# Patient Record
Sex: Female | Born: 1985 | Race: Black or African American | Hispanic: No | Marital: Single | Smoking: Former smoker
Health system: Southern US, Community
[De-identification: ages and names within clinical notes are randomized; demographics above are authoritative.]

## PROBLEM LIST (undated history)

## (undated) ENCOUNTER — Inpatient Hospital Stay (HOSPITAL_COMMUNITY): Payer: Self-pay

## (undated) DIAGNOSIS — Z975 Presence of (intrauterine) contraceptive device: Secondary | ICD-10-CM

## (undated) DIAGNOSIS — O139 Gestational [pregnancy-induced] hypertension without significant proteinuria, unspecified trimester: Secondary | ICD-10-CM

## (undated) DIAGNOSIS — Z8619 Personal history of other infectious and parasitic diseases: Secondary | ICD-10-CM

## (undated) DIAGNOSIS — R768 Other specified abnormal immunological findings in serum: Secondary | ICD-10-CM

## (undated) DIAGNOSIS — I10 Essential (primary) hypertension: Secondary | ICD-10-CM

## (undated) DIAGNOSIS — O24419 Gestational diabetes mellitus in pregnancy, unspecified control: Secondary | ICD-10-CM

## (undated) DIAGNOSIS — O1490 Unspecified pre-eclampsia, unspecified trimester: Secondary | ICD-10-CM

## (undated) DIAGNOSIS — R87610 Atypical squamous cells of undetermined significance on cytologic smear of cervix (ASC-US): Secondary | ICD-10-CM

## (undated) HISTORY — DX: Personal history of other infectious and parasitic diseases: Z86.19

## (undated) HISTORY — DX: Presence of (intrauterine) contraceptive device: Z97.5

## (undated) HISTORY — DX: Essential (primary) hypertension: I10

## (undated) HISTORY — DX: Atypical squamous cells of undetermined significance on cytologic smear of cervix (ASC-US): R87.610

## (undated) HISTORY — PX: BREAST CYST EXCISION: SHX579

## (undated) HISTORY — PX: BREAST SURGERY: SHX581

## (undated) HISTORY — DX: Gestational diabetes mellitus in pregnancy, unspecified control: O24.419

---
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
## 2006-01-21 ENCOUNTER — Emergency Department (HOSPITAL_COMMUNITY): Admission: EM | Admit: 2006-01-21 | Discharge: 2006-01-21 | Payer: Self-pay | Admitting: Emergency Medicine

## 2006-10-19 ENCOUNTER — Emergency Department (HOSPITAL_COMMUNITY): Admission: EM | Admit: 2006-10-19 | Discharge: 2006-10-19 | Payer: Self-pay | Admitting: Emergency Medicine

## 2006-12-28 ENCOUNTER — Inpatient Hospital Stay (HOSPITAL_COMMUNITY): Admission: AD | Admit: 2006-12-28 | Discharge: 2006-12-28 | Payer: Self-pay | Admitting: Obstetrics & Gynecology

## 2007-01-31 ENCOUNTER — Ambulatory Visit (HOSPITAL_COMMUNITY): Admission: RE | Admit: 2007-01-31 | Discharge: 2007-01-31 | Payer: Self-pay | Admitting: Obstetrics

## 2007-04-13 ENCOUNTER — Inpatient Hospital Stay (HOSPITAL_COMMUNITY): Admission: AD | Admit: 2007-04-13 | Discharge: 2007-04-13 | Payer: Self-pay | Admitting: Obstetrics & Gynecology

## 2007-05-16 ENCOUNTER — Inpatient Hospital Stay (HOSPITAL_COMMUNITY): Admission: AD | Admit: 2007-05-16 | Discharge: 2007-05-21 | Payer: Self-pay | Admitting: Obstetrics

## 2008-05-09 ENCOUNTER — Emergency Department (HOSPITAL_COMMUNITY): Admission: EM | Admit: 2008-05-09 | Discharge: 2008-05-09 | Payer: Self-pay | Admitting: Emergency Medicine

## 2008-05-23 ENCOUNTER — Emergency Department (HOSPITAL_COMMUNITY): Admission: EM | Admit: 2008-05-23 | Discharge: 2008-05-23 | Payer: Self-pay | Admitting: Emergency Medicine

## 2008-08-26 ENCOUNTER — Emergency Department (HOSPITAL_COMMUNITY): Admission: EM | Admit: 2008-08-26 | Discharge: 2008-08-27 | Payer: Self-pay | Admitting: Emergency Medicine

## 2008-08-27 ENCOUNTER — Ambulatory Visit (HOSPITAL_COMMUNITY): Admission: RE | Admit: 2008-08-27 | Discharge: 2008-08-27 | Payer: Self-pay | Admitting: Emergency Medicine

## 2010-06-26 LAB — CBC
HCT: 28.3 % — ABNORMAL LOW (ref 36.0–46.0)
Hemoglobin: 10 g/dL — ABNORMAL LOW (ref 12.0–15.0)
MCHC: 35.2 g/dL (ref 30.0–36.0)
MCV: 88.1 fL (ref 78.0–100.0)
RBC: 3.22 MIL/uL — ABNORMAL LOW (ref 3.87–5.11)
WBC: 8.5 10*3/uL (ref 4.0–10.5)

## 2010-06-26 LAB — DIFFERENTIAL
Eosinophils Absolute: 0.2 10*3/uL (ref 0.0–0.7)
Eosinophils Relative: 2 % (ref 0–5)
Lymphs Abs: 1.6 10*3/uL (ref 0.7–4.0)
Monocytes Absolute: 1.1 10*3/uL — ABNORMAL HIGH (ref 0.1–1.0)
Monocytes Relative: 13 % — ABNORMAL HIGH (ref 3–12)
Neutrophils Relative %: 65 % (ref 43–77)

## 2010-08-04 NOTE — Op Note (Signed)
   NAME:  Julia [name], Julia [name]                        ACCOUNT NO.:  [id number]   MEDICAL RECORD NO.:  [id number]                   PATIENT TYPE:  AMB   LOCATION:  DAY                                  FACILITY:  [facility]   PHYSICIAN:  Jerolyn Shin C. Katrinka Blazing, M.D.                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  The  patient has developed a mass in the left upper outer quadrant.   PREOPERATIVE DIAGNOSIS:  Left breast mass.   POSTOPERATIVE DIAGNOSIS:  Left breast mass.   PROCEDURE:  Left breast biopsy.   SURGEON:  Dirk Dress. Katrinka Blazing, M.D.   DESCRIPTION OF PROCEDURE:  Under general LMA anesthesia, the left breast was  prepped and draped in a sterile field.   A curvilinear incision was made in the upper outer quadrant over the mass  which was quite mobile.  The incision extended through the subcutaneous  tissue to the mass and breast tissue surrounding it.  This tissue was  excised using electrocautery without difficulty.  The mass was sent for  pathologic evaluation.  The tissues were closed with 3-0 Biosyn of the depth  of the wound up to the subcutaneous area.  The skin and subcutaneous tissue  was closed with 4-0 Dexon.  The patient tolerated the procedure well.  A  sterile dressing was placed.   She was awakened from anesthesia uneventfully, transferred to a bed, and  taken to the postanesthetic care unit.                                               Dirk Dress. Katrinka Blazing, M.D.    LCS/MEDQ  D:  10/13/2002  T:  10/13/2002  Job:  [id number]

## 2010-08-04 NOTE — H&P (Signed)
   NAME:  Julia [name], Julia [name]                        ACCOUNT NO.:  [id number]   MEDICAL RECORD NO.:  [id number]                   PATIENT TYPE:  AMB   LOCATION:  DAY                                  FACILITY:  [facility]   PHYSICIAN:  Jerolyn Shin C. Katrinka Blazing, M.D.                DATE OF BIRTH:  September 10, 1985   DATE OF ADMISSION:  DATE OF DISCHARGE:                                HISTORY & PHYSICAL   HISTORY OF PRESENT ILLNESS:  A 25 year old female with history of recurrent  mass in the left breast.  The patient is status post excision of a juvenile  fibroadenoma.  She had excision of the juvenile adenoma August 2002.  Her  previous mass was in the lower inner quadrant.  Present mass in the upper  outer quadrant.  She is scheduled to have this mass removed under  anesthesia.   PAST MEDICAL HISTORY:  No medical illnesses.   ALLERGIES:  No known drug allergies.   MEDICATIONS:  None.   PAST SURGICAL HISTORY:  Left breast biopsy.   SOCIAL HISTORY:  The patient does not drink, smoke, or use drugs.   PHYSICAL EXAMINATION:  GENERAL:  Healthy-appearing young female in no acute  distress.  VITAL SIGNS:  Blood pressure 100/70, pulse 90, respirations 20.  Weight 106  pounds.  HEENT:  Unremarkable.  NECK:  Supple without JVD or bruits.  CHEST:  Clear to auscultation.  HEART:  Regular rate and rhythm without murmur, gallop, or rub.  BREASTS: Very nodular breast with a well-healed incision in the left lower  inner quadrant and a palpable mass in the led upper outer quadrant.  No  axillary masses.  Right breast normal.  ABDOMEN:  Soft, nontender, no masses.  EXTREMITIES:  No cyanosis, clubbing, or edema.  NEUROLOGIC:  Nonfocal.  No motor, sensory, or cerebellar deficits.   IMPRESSION:  1. Left breast mass.  2. History of juvenile fibroadenoma, left breast.   PLAN:  Left breast biopsy.                                              Dirk Dress. Katrinka Blazing, M.D.   LCS/MEDQ  D:  10/12/2002  T:  10/13/2002  Job:   [id number]

## 2010-12-07 LAB — URINALYSIS, ROUTINE W REFLEX MICROSCOPIC
Glucose, UA: 250 — AB
Hgb urine dipstick: NEGATIVE
Ketones, ur: 15 — AB
pH: 5.5

## 2010-12-08 LAB — COMPREHENSIVE METABOLIC PANEL
ALT: 24
ALT: 24
Albumin: 2.3 — ABNORMAL LOW
Alkaline Phosphatase: 109
Alkaline Phosphatase: 114
CO2: 23
GFR calc Af Amer: >60
GFR calc non Af Amer: >60
Glucose, Bld: 96
Potassium: 3.6
Potassium: 3.9
Sodium: 132 — ABNORMAL LOW
Sodium: 133 — ABNORMAL LOW
Total Protein: 5.7 — ABNORMAL LOW

## 2010-12-08 LAB — CBC
HCT: 34.1 — ABNORMAL LOW
Hemoglobin: 12
MCHC: 35.3
Platelets: 194
RBC: 3.69 — ABNORMAL LOW
RDW: 13.5

## 2010-12-08 LAB — RPR: RPR Ser Ql: NONREACTIVE

## 2010-12-08 LAB — URIC ACID
Uric Acid, Serum: 5.7
Uric Acid, Serum: 6.4

## 2010-12-11 LAB — CBC
HCT: 32.4 — ABNORMAL LOW
Hemoglobin: 11.3 — ABNORMAL LOW
MCHC: 34.9
RDW: 13.8

## 2010-12-28 LAB — URINALYSIS, ROUTINE W REFLEX MICROSCOPIC
Bilirubin Urine: NEGATIVE
Hgb urine dipstick: NEGATIVE
Specific Gravity, Urine: >1.03 — ABNORMAL HIGH
Urobilinogen, UA: 0.2

## 2010-12-28 LAB — URINE CULTURE: Colony Count: NO GROWTH

## 2011-01-01 LAB — DIFFERENTIAL
Basophils Absolute: 0
Basophils Relative: 0
Eosinophils Absolute: 0.3
Neutro Abs: 8.4 — ABNORMAL HIGH
Neutrophils Relative %: 70

## 2011-01-01 LAB — URINALYSIS, ROUTINE W REFLEX MICROSCOPIC
Bilirubin Urine: NEGATIVE
Ketones, ur: NEGATIVE
Nitrite: NEGATIVE
Protein, ur: NEGATIVE
Specific Gravity, Urine: >1.03 — ABNORMAL HIGH
Urobilinogen, UA: 0.2

## 2011-01-01 LAB — PREGNANCY, URINE: Preg Test, Ur: POSITIVE

## 2011-01-01 LAB — WET PREP, GENITAL
Clue Cells Wet Prep HPF POC: NONE SEEN
Trich, Wet Prep: NONE SEEN
WBC, Wet Prep HPF POC: NONE SEEN
Yeast Wet Prep HPF POC: NONE SEEN

## 2011-01-01 LAB — CBC
MCHC: 34.1
MCV: 91.3
RDW: 13

## 2011-01-01 LAB — ABO/RH: ABO/RH(D): A POS

## 2011-01-01 LAB — GC/CHLAMYDIA PROBE AMP, GENITAL: GC Probe Amp, Genital: NEGATIVE

## 2011-03-11 ENCOUNTER — Emergency Department (HOSPITAL_COMMUNITY): Payer: Self-pay

## 2011-03-11 ENCOUNTER — Emergency Department (HOSPITAL_COMMUNITY)
Admission: EM | Admit: 2011-03-11 | Discharge: 2011-03-11 | Disposition: A | Discharge status: Home / Self Care | Payer: Self-pay | Attending: Emergency Medicine | Admitting: Emergency Medicine

## 2011-03-11 DIAGNOSIS — Y9269 Other specified industrial and construction area as the place of occurrence of the external cause: Secondary | ICD-10-CM | POA: Insufficient documentation

## 2011-03-11 DIAGNOSIS — S93409A Sprain of unspecified ligament of unspecified ankle, initial encounter: Secondary | ICD-10-CM | POA: Insufficient documentation

## 2011-03-11 DIAGNOSIS — X500XXA Overexertion from strenuous movement or load, initial encounter: Secondary | ICD-10-CM | POA: Insufficient documentation

## 2011-03-11 MED ORDER — HYDROCODONE-ACETAMINOPHEN 5-325 MG PO TABS: ORAL_TABLET | ORAL | Status: AC

## 2011-03-11 MED ORDER — NAPROXEN 500 MG PO TABS: 500.0000 mg | ORAL_TABLET | Freq: Two times a day (BID) | ORAL | Status: DC

## 2011-03-11 NOTE — ED Provider Notes (Signed)
History     CSN: [id number]  Arrival date & time 03/11/11  4098   First MD Initiated Contact with Patient 03/11/11 1952      Chief Complaint  Patient presents with  . Ankle Injury    (Consider location/radiation/quality/duration/timing/severity/associated sxs/prior treatment) Patient is a 25 y.o. female presenting with ankle pain. The history is provided by the patient.  Ankle Pain  The incident occurred 2 days ago. The incident occurred at work. The injury mechanism was torsion. The pain is present in the right ankle. The quality of the pain is described as aching. The pain is mild. The pain has been constant since onset. Pertinent negatives include no numbness, no inability to bear weight, no loss of motion, no muscle weakness, no loss of sensation and no tingling. She reports no foreign bodies present. The symptoms are aggravated by activity, bearing weight and palpation. She has tried nothing for the symptoms. The treatment provided no relief.    History reviewed. No pertinent past medical history.  Past Surgical History  Procedure Date  . Breast surgery     No family history on file.  History  Substance Use Topics  . Smoking status: Never Smoker   . Smokeless tobacco: Not on file  . Alcohol Use: Yes    OB History    Grav Para Term Preterm Abortions TAB SAB Ect Mult Living                  Review of Systems  Constitutional: Negative for fever and chills.  HENT: Negative for neck pain.   Musculoskeletal: Positive for arthralgias. Negative for myalgias and back pain.  Skin: Negative.  Negative for rash.  Neurological: Negative for dizziness, tingling, weakness and numbness.  Hematological: Does not bruise/bleed easily.    Allergies  Review of patient's allergies indicates no known allergies.  Home Medications  No current outpatient prescriptions on file.  BP 133/77  Pulse 78  Temp(Src) 98.7 F (37.1 C) (Oral)  Resp 16  Ht 5\' 6"  (1.676 m)  Wt 154 lb  (69.854 kg)  BMI 24.86 kg/m2  SpO2 100%  LMP 02/11/2011  Physical Exam  Nursing note and vitals reviewed. Constitutional: She is oriented to person, place, and time. She appears well-developed and well-nourished. No distress.  HENT:  Head: Normocephalic and atraumatic.  Neck: Normal range of motion. Neck supple.  Cardiovascular: Normal rate, regular rhythm and normal heart sounds.   Pulmonary/Chest: Effort normal and breath sounds normal. No respiratory distress. She exhibits no tenderness.  Musculoskeletal: Normal range of motion. She exhibits tenderness. She exhibits no edema.       Right ankle: She exhibits normal range of motion, no swelling, no ecchymosis, no deformity, no laceration and normal pulse. tenderness. Lateral malleolus tenderness found. No medial malleolus, no posterior TFL, no head of 5th metatarsal and no proximal fibula tenderness found. Achilles tendon normal.  Lymphadenopathy:    She has no cervical adenopathy.  Neurological: She is alert and oriented to person, place, and time. No cranial nerve deficit. She exhibits normal muscle tone. Coordination normal.  Skin: Skin is warm and dry.    ED Course  SPLINT APPLICATION Date/Time: 03/11/2011 8:52 PM Performed by: Trisha Mangle, Bishop Vanderwerf L. Authorized by: Maxwell Caul Consent: Verbal consent obtained. Written consent not obtained. Consent given by: patient Patient understanding: patient states understanding of the procedure being performed Patient consent: the patient's understanding of the procedure matches consent given Imaging studies: imaging studies available Patient identity confirmed: verbally with  patient Time out: Immediately prior to procedure a "time out" was called to verify the correct patient, procedure, equipment, support staff and site/side marked as required. Location details: right ankle Splint type: aso spliint. Supplies used: crutches. Post-procedure: The splinted body part was neurovascularly  unchanged following the procedure. Patient tolerance: Patient tolerated the procedure well with no immediate complications.   (including critical care time)  Labs Reviewed - No data to display Dg Ankle Complete Right  03/11/2011  *RADIOLOGY REPORT*  Clinical Data: Ankle injury.  Ankle pain and swelling.  RIGHT ANKLE - COMPLETE 3+ VIEW  Comparison: None.  Findings: Soft tissue swelling is seen overlying the lateral malleolus.  No ankle joint effusion identified.  No evidence of fracture or dislocation.  No other significant bone abnormality identified.  IMPRESSION: Lateral soft tissue swelling.  No evidence of fracture.  Original Report Authenticated By: Danae Orleans, M.D.        MDM   Mild ttp of the lateral right malleolus.  Mild STS.  No obvious ligament instability.  No bruising.  Will apply ASO splint, crutches and pt agrees to ortho f/u        Adolphus Hanf L. Avary P[place]en[place]ger, [place] 03/14/11 2050

## 2011-03-11 NOTE — ED Notes (Signed)
Pt presents with right ankle pain after twisting it on Thursday. Pt limping. No bruising noted. Swelling present and no obvious deformity noted.

## 2011-03-15 NOTE — ED Provider Notes (Signed)
Medical screening examination/treatment/procedure(s) were performed by non-physician practitioner and as supervising physician I was immediately available for consultation/collaboration.  Caliann Leckrone L Rucker Pridgeon, MD 03/15/11 0059 

## 2011-07-13 ENCOUNTER — Encounter (HOSPITAL_COMMUNITY): Payer: Self-pay

## 2011-07-13 ENCOUNTER — Emergency Department (HOSPITAL_COMMUNITY)
Admission: EM | Admit: 2011-07-13 | Discharge: 2011-07-14 | Disposition: A | Discharge status: Home / Self Care | Payer: Self-pay | Attending: Emergency Medicine | Admitting: Emergency Medicine

## 2011-07-13 DIAGNOSIS — R51 Headache: Secondary | ICD-10-CM | POA: Insufficient documentation

## 2011-07-13 DIAGNOSIS — R42 Dizziness and giddiness: Secondary | ICD-10-CM | POA: Insufficient documentation

## 2011-07-13 DIAGNOSIS — H538 Other visual disturbances: Secondary | ICD-10-CM | POA: Insufficient documentation

## 2011-07-13 HISTORY — DX: Unspecified pre-eclampsia, unspecified trimester: O14.90

## 2011-07-13 MED ORDER — DIPHENHYDRAMINE HCL 50 MG/ML IJ SOLN
25.0000 mg | Freq: Once | INTRAMUSCULAR | Status: AC
  Administered 2011-07-13: 25 mg via INTRAVENOUS
  Filled 2011-07-13: qty 1

## 2011-07-13 MED ORDER — SODIUM CHLORIDE 0.9 % IV BOLUS (SEPSIS)
1000.0000 mL | Freq: Once | INTRAVENOUS | Status: AC
  Administered 2011-07-13: 1000 mL via INTRAVENOUS

## 2011-07-13 MED ORDER — METOCLOPRAMIDE HCL 5 MG/ML IJ SOLN
10.0000 mg | Freq: Once | INTRAMUSCULAR | Status: AC
  Administered 2011-07-13: 10 mg via INTRAVENOUS
  Filled 2011-07-13: qty 2

## 2011-07-13 MED ORDER — DEXAMETHASONE SODIUM PHOSPHATE 4 MG/ML IJ SOLN
10.0000 mg | Freq: Once | INTRAMUSCULAR | Status: AC
  Administered 2011-07-13: 10 mg via INTRAVENOUS
  Filled 2011-07-13: qty 3

## 2011-07-13 NOTE — ED Notes (Signed)
C/o HA with nausea, + family hx of HTN

## 2011-07-13 NOTE — ED Notes (Signed)
Pt presents with dizziness and blurred vision to left eye that started today around 11am. Pt denies Hx of HTN.

## 2011-07-14 MED ORDER — PROMETHAZINE HCL 25 MG PO TABS: 25.0000 mg | ORAL_TABLET | Freq: Four times a day (QID) | ORAL | Status: DC | PRN

## 2011-07-14 NOTE — ED Notes (Signed)
Discharge instructions reviewed with pt, questions answered. Pt verbalized understanding.  

## 2011-07-14 NOTE — Discharge Instructions (Signed)
Headaches, Frequently Asked Questions Rest, drink clear fluids and take Tylenol as needed for headaches. Call your doctor for followup in the clinic as discussed regarding your pregnancy test. Continue taking vitamins.  MIGRAINE HEADACHES Q: What is migraine? What causes it? How can I treat it? A: Generally, migraine headaches begin as a dull ache. Then they develop into a constant, throbbing, and pulsating pain. You may experience pain at the temples. You may experience pain at the front or back of one or both sides of the head. The pain is usually accompanied by a combination of:  Nausea.   Vomiting.   Sensitivity to light and noise.  Some people (about 15%) experience an aura (see below) before an attack. The cause of migraine is believed to be chemical reactions in the brain. Treatment for migraine may include over-the-counter or prescription medications. It may also include self-help techniques. These include relaxation training and biofeedback.  Q: What is an aura? A: About 15% of people with migraine get an "aura". This is a sign of neurological symptoms that occur before a migraine headache. You may see wavy or jagged lines, dots, or flashing lights. You might experience tunnel vision or blind spots in one or both eyes. The aura can include visual or auditory hallucinations (something imagined). It may include disruptions in smell (such as strange odors), taste or touch. Other symptoms include:  Numbness.   A "pins and needles" sensation.   Difficulty in recalling or speaking the correct word.  These neurological events may last as long as 60 minutes. These symptoms will fade as the headache begins. Q: What is a trigger? A: Certain physical or environmental factors can lead to or "trigger" a migraine. These include:  Foods.   Hormonal changes.   Weather.   Stress.  It is important to remember that triggers are different for everyone. To help prevent migraine attacks, you need  to figure out which triggers affect you. Keep a headache diary. This is a good way to track triggers. The diary will help you talk to your healthcare professional about your condition. Q: Does weather affect migraines? A: Bright sunshine, hot, humid conditions, and drastic changes in barometric pressure may lead to, or "trigger," a migraine attack in some people. But studies have shown that weather does not act as a trigger for everyone with migraines. Q: What is the link between migraine and hormones? A: Hormones start and regulate many of your body's functions. Hormones keep your body in balance within a constantly changing environment. The levels of hormones in your body are unbalanced at times. Examples are during menstruation, pregnancy, or menopause. That can lead to a migraine attack. In fact, about three quarters of all women with migraine report that their attacks are related to the menstrual cycle.  Q: Is there an increased risk of stroke for migraine sufferers? A: The likelihood of a migraine attack causing a stroke is very remote. That is not to say that migraine sufferers cannot have a stroke associated with their migraines. In persons under age 73, the most common associated factor for stroke is migraine headache. But over the course of a person's normal life span, the occurrence of migraine headache may actually be associated with a reduced risk of dying from cerebrovascular disease due to stroke.  Q: What are acute medications for migraine? A: Acute medications are used to treat the pain of the headache after it has started. Examples over-the-counter medications, NSAIDs, ergots, and triptans.  Q: What are  the triptans? A: Triptans are the newest class of abortive medications. They are specifically targeted to treat migraine. Triptans are vasoconstrictors. They moderate some chemical reactions in the brain. The triptans work on receptors in your brain. Triptans help to restore the balance of  a neurotransmitter called serotonin. Fluctuations in levels of serotonin are thought to be a main cause of migraine.  Q: Are over-the-counter medications for migraine effective? A: Over-the-counter, or "OTC," medications may be effective in relieving mild to moderate pain and associated symptoms of migraine. But you should see your caregiver before beginning any treatment regimen for migraine.  Q: What are preventive medications for migraine? A: Preventive medications for migraine are sometimes referred to as "prophylactic" treatments. They are used to reduce the frequency, severity, and length of migraine attacks. Examples of preventive medications include antiepileptic medications, antidepressants, beta-blockers, calcium channel blockers, and NSAIDs (nonsteroidal anti-inflammatory drugs). Q: Why are anticonvulsants used to treat migraine? A: During the past few years, there has been an increased interest in antiepileptic drugs for the prevention of migraine. They are sometimes referred to as "anticonvulsants". Both epilepsy and migraine may be caused by similar reactions in the brain.  Q: Why are antidepressants used to treat migraine? A: Antidepressants are typically used to treat people with depression. They may reduce migraine frequency by regulating chemical levels, such as serotonin, in the brain.  Q: What alternative therapies are used to treat migraine? A: The term "alternative therapies" is often used to describe treatments considered outside the scope of conventional Western medicine. Examples of alternative therapy include acupuncture, acupressure, and yoga. Another common alternative treatment is herbal therapy. Some herbs are believed to relieve headache pain. Always discuss alternative therapies with your caregiver before proceeding. Some herbal products contain arsenic and other toxins. TENSION HEADACHES Q: What is a tension-type headache? What causes it? How can I treat it? A:  Tension-type headaches occur randomly. They are often the result of temporary stress, anxiety, fatigue, or anger. Symptoms include soreness in your temples, a tightening band-like sensation around your head (a "vice-like" ache). Symptoms can also include a pulling feeling, pressure sensations, and contracting head and neck muscles. The headache begins in your forehead, temples, or the back of your head and neck. Treatment for tension-type headache may include over-the-counter or prescription medications. Treatment may also include self-help techniques such as relaxation training and biofeedback. CLUSTER HEADACHES Q: What is a cluster headache? What causes it? How can I treat it? A: Cluster headache gets its name because the attacks come in groups. The pain arrives with little, if any, warning. It is usually on one side of the head. A tearing or bloodshot eye and a runny nose on the same side of the headache may also accompany the pain. Cluster headaches are believed to be caused by chemical reactions in the brain. They have been described as the most severe and intense of any headache type. Treatment for cluster headache includes prescription medication and oxygen. SINUS HEADACHES Q: What is a sinus headache? What causes it? How can I treat it? A: When a cavity in the bones of the face and skull (a sinus) becomes inflamed, the inflammation will cause localized pain. This condition is usually the result of an allergic reaction, a tumor, or an infection. If your headache is caused by a sinus blockage, such as an infection, you will probably have a fever. An x-ray will confirm a sinus blockage. Your caregiver's treatment might include antibiotics for the infection, as well as  antihistamines or decongestants.  REBOUND HEADACHES Q: What is a rebound headache? What causes it? How can I treat it? A: A pattern of taking acute headache medications too often can lead to a condition known as "rebound headache." A  pattern of taking too much headache medication includes taking it more than 2 days per week or in excessive amounts. That means more than the label or a caregiver advises. With rebound headaches, your medications not only stop relieving pain, they actually begin to cause headaches. Doctors treat rebound headache by tapering the medication that is being overused. Sometimes your caregiver will gradually substitute a different type of treatment or medication. Stopping may be a challenge. Regularly overusing a medication increases the potential for serious side effects. Consult a caregiver if you regularly use headache medications more than 2 days per week or more than the label advises. ADDITIONAL QUESTIONS AND ANSWERS Q: What is biofeedback? A: Biofeedback is a self-help treatment. Biofeedback uses special equipment to monitor your body's involuntary physical responses. Biofeedback monitors:  Breathing.   Pulse.   Heart rate.   Temperature.   Muscle tension.   Brain activity.  Biofeedback helps you refine and perfect your relaxation exercises. You learn to control the physical responses that are related to stress. Once the technique has been mastered, you do not need the equipment any more. Q: Are headaches hereditary? A: Four out of five (80%) of people that suffer report a family history of migraine. Scientists are not sure if this is genetic or a family predisposition. Despite the uncertainty, a child has a 50% chance of having migraine if one parent suffers. The child has a 75% chance if both parents suffer.  Q: Can children get headaches? A: By the time they reach high school, most young people have experienced some type of headache. Many safe and effective approaches or medications can prevent a headache from occurring or stop it after it has begun.  Q: What type of doctor should I see to diagnose and treat my headache? A: Start with your primary caregiver. Discuss his or her experience and  approach to headaches. Discuss methods of classification, diagnosis, and treatment. Your caregiver may decide to recommend you to a headache specialist, depending upon your symptoms or other physical conditions. Having diabetes, allergies, etc., may require a more comprehensive and inclusive approach to your headache. The [employer] will provide, upon request, a list of [facility] physician members in your state.

## 2011-07-14 NOTE — ED Provider Notes (Signed)
History     CSN: [id number]  Arrival date & time 07/13/11  2019   First MD Initiated Contact with Patient 07/13/11 2300      Chief complaint headache  (Consider location/radiation/quality/duration/timing/severity/associated sxs/prior treatment) HPI History provided by the patient. Patient complaining of gradual onset headache worsening throughout the day. She has some associated blurry vision left eye and feels lightheaded. Light does bother her eyes some. She denies a history of migraine headaches. No weakness or numbness. No difficulty with speech or gait. LMP April 5, Normal and on time. No abdominal pain, vaginal bleeding or discharge   Past Medical History  Diagnosis Date  . Preeclampsia     Past Surgical History  Procedure Date  . Breast surgery     No family history on file.  History  Substance Use Topics  . Smoking status: Never Smoker   . Smokeless tobacco: Not on file  . Alcohol Use: Yes    OB History    Grav Para Term Preterm Abortions TAB SAB Ect Mult Living                  Review of Systems  Constitutional: Negative for fever and chills.  HENT: Negative for neck pain and neck stiffness.   Eyes: Negative for pain.  Respiratory: Negative for shortness of breath.   Cardiovascular: Negative for chest pain.  Gastrointestinal: Negative for abdominal pain.  Genitourinary: Negative for dysuria.  Musculoskeletal: Negative for back pain.  Skin: Negative for rash.  Neurological: Positive for headaches.  All other systems reviewed and are negative.    Allergies  Review of patient's allergies indicates no known allergies.  Home Medications   Current Outpatient Rx  Name Route Sig Dispense Refill  . IBUPROFEN 200 MG PO TABS Oral Take 800 mg by mouth as needed. For pain    . ONE-A-DAY WOMENS PO Oral Take 1 tablet by mouth daily.      BP 144/98  Pulse 86  Temp(Src) 98.1 F (36.7 C) (Oral)  Resp 20  Ht 5\' 5"  (1.651 m)  Wt 154 lb (69.854 kg)  BMI  25.63 kg/m2  SpO2 100%  LMP 06/25/2011  Physical Exam  Constitutional: She is oriented to person, place, and time. She appears well-developed and well-nourished.  HENT:  Head: Normocephalic and atraumatic.  Eyes: Conjunctivae and EOM are normal. Pupils are equal, round, and reactive to light.  Neck: Full passive range of motion without pain. Neck supple. No thyromegaly present.       No meningismus  Cardiovascular: Normal rate, regular rhythm, S1 normal, S2 normal and intact distal pulses.   Pulmonary/Chest: Effort normal and breath sounds normal.  Abdominal: Soft. Bowel sounds are normal. She exhibits no mass. There is no tenderness. There is no rebound, no guarding and no CVA tenderness.  Musculoskeletal: Normal range of motion.  Neurological: She is alert and oriented to person, place, and time. She has normal strength and normal reflexes. No cranial nerve deficit or sensory deficit. She displays a negative Romberg sign. GCS eye subscore is 4. GCS verbal subscore is 5. GCS motor subscore is 6.       Normal Gait  Skin: Skin is warm and dry. No rash noted. No cyanosis. Nails show no clubbing.  Psychiatric: She has a normal mood and affect. Her speech is normal and behavior is normal.    ED Course  Procedures (including critical care time)  Results for orders placed during the hospital encounter of 08/26/08  CBC  Component Value Range   WBC 8.5  4.0 - 10.5 (K/uL)   RBC 3.22 (*) 3.87 - 5.11 (MIL/uL)   Hemoglobin 10.0 (*) 12.0 - 15.0 (g/dL)   HCT 16.1 (*) 09.6 - 46.0 (%)   MCV 88.1  78.0 - 100.0 (fL)   MCHC 35.2  30.0 - 36.0 (g/dL)   RDW 04.5  40.9 - 81.1 (%)   Platelets 356  150 - 400 (K/uL)  DIFFERENTIAL      Component Value Range   Neutrophils Relative 65  43 - 77 (%)   Neutro Abs 5.6  1.7 - 7.7 (K/uL)   Lymphocytes Relative 19  12 - 46 (%)   Lymphs Abs 1.6  0.7 - 4.0 (K/uL)   Monocytes Relative 13 (*) 3 - 12 (%)   Monocytes Absolute 1.1 (*) 0.1 - 1.0 (K/uL)    Eosinophils Relative 2  0 - 5 (%)   Eosinophils Absolute 0.2  0.0 - 0.7 (K/uL)   Basophils Relative 1  0 - 1 (%)   Basophils Absolute 0.1  0.0 - 0.1 (K/uL)  PREGNANCY, URINE      Component Value Range   Preg Test, Ur POSITIVE     IV fluids and headache cocktail provided. On recheck feeling improved with no change normal neuro exam.  Patient notified of positive pregnancy test and she has an OB/GYN in [place], Dr. Tamela Oddi.no pelvic pain or bleeding or indication for emergent ultrasound at this time. PT agrees to continue taking over-the-counter vitamins and follow up with her OB/GYN  MDM   Headache with no neuro deficits. No indication for emergent imaging. Condition improved in the ED.  plan outpatient followup.         Sunnie Nielsen, MD 07/14/11 [phone]

## 2011-07-14 NOTE — ED Notes (Signed)
MD at bedside. 

## 2011-12-13 ENCOUNTER — Other Ambulatory Visit (HOSPITAL_COMMUNITY)
Admission: RE | Admit: 2011-12-13 | Discharge: 2011-12-13 | Disposition: A | Discharge status: Home / Self Care | Payer: Self-pay | Source: Ambulatory Visit | Attending: Obstetrics and Gynecology | Admitting: Obstetrics and Gynecology

## 2011-12-13 DIAGNOSIS — Z01419 Encounter for gynecological examination (general) (routine) without abnormal findings: Secondary | ICD-10-CM | POA: Insufficient documentation

## 2012-03-19 NOTE — L&D Delivery Note (Signed)
Attestation of Attending Supervision of Advanced Practitioner: Evaluation and management procedures were performed by the PA/NP/CNM/OB Fellow under my supervision/collaboration. Chart reviewed and agree with management and plan. I was present and actively involve in delivery and decisions, and agree with documentation. The cord with true knot observed was a prolapse of the cord of baby B, which was addressed by expediting delivery of baby B by vacuum assist.  Rodert Hinch V 02/15/2013 9:02 PM

## 2012-03-19 NOTE — L&D Delivery Note (Signed)
  Julia, Gambrill [name] [[id number]]  Operative Delivery Note At 4:04 PM a viable female was delivered via Vaginal, Spontaneous Delivery.  Presentation:compound hand/ vertex; Position: Left,, Occiput,, Anterior    APGAR: Pending, crying at pernieum, ; weight .   Placenta status: Intact, Spontaneous.   Cord: 3 vessels with the following complications: .  Cord pH: not collected  Anesthesia: Epidural  Episiotomy: None Lacerations: 2nd degree;Perineal Suture Repair: 3.0 vicryl on CT Est. Blood Loss (mL): 400  Mom to postpartum.  Baby to Couplet care / Skin to Skin.  Tawana Scale 02/15/2013, [name], Julia [name] [[id number]]  Operative Delivery Note At 4:09 PM a viable female was delivered via Vaginal, Spontaneous Delivery.  Presentation: vertex; Position: Occiput,, Anterior; Station: +1.  Verbal consent: unable to obtain verbal consent due to immediate fetal emergency with cord prolapse.  Unable to discuss Risks and benefits in detail.  Risks include, but are not limited to the risks of anesthesia, bleeding, infection, damage to maternal tissues, fetal cephalhematoma.  There is also the risk of inability to effect vaginal delivery of the head, or shoulder dystocia that cannot be resolved by established maneuvers, leading to the need for emergency cesarean section.  APGAR: Pending, given to waiting NICU team, ; weight .   Placenta status: Intact, Spontaneous.   Cord: 3 vessels with the following complications: Prolapsed Knot.   Results for AGATHA, DUPLECHAIN (MRN [id number]) as of 02/15/2013 16:49  Ref. Range 02/15/2013 16:20  pH cord blood No range found 7.323  pCO2 cord blood No range found 43.1  Bicarbonate Latest Range: 20.0-24.0 mEq/L 21.7  TCO2 Latest Range: 0-100 mmol/L 23.0  Acid-base deficit Latest Range: 0.0-2.0 mmol/L 3.8 (H)    Anesthesia: Epidural  Instruments: Kiwi Episiotomy: None Lacerations: Perineal;2nd degree Suture Repair: 3.0 vicryl  on CT Est. Blood Loss (mL): 400  Mom to postpartum.  Baby to Couplet care / Skin to Skin.  Twin delivery of di/mono of NSVD over 2nd degree tear. Pt successfully pushed baby A with compound hand presentation to successful head delivery. There was a very short shoulder dystocia resolved with wood screw and lasted <30s. Did not require mcroberts or fundal pressure. Cord clamped and baby delivered to maternal abdomen then taken to warmer. Umbilical clamped placed on Baby A cord  Baby B with intact bulging membranes without palpable cord AROM'd with contraction. Cord with true knot observed. Recognized and delivery team informed. Placed a kiwi vacuum on fetal occiput in emergent situation. Gentle fundal pressure, traction with 1 pull and maternal pushing delivered a viable female infant over preexisting 2nd degree tear. Cord was clamped, cut, and infant given to waiting NICU team. Cord segment collected, Cord gas submitted with pH of 7.3. Infant did well under excellent care of resuscitation team and was given to mother shortly after.   Active management of 3rd stage delivered intact placenta with 2 3v cords.  2nd degree tear repaired in usual manner with 3.0 vicryl on CT. EBL 400, counts correct  Yerachmiel Spinney RYAN 02/15/2013, 4:45 PM

## 2012-05-21 ENCOUNTER — Encounter: Payer: Self-pay | Admitting: *Deleted

## 2012-07-09 ENCOUNTER — Encounter: Payer: Self-pay | Admitting: *Deleted

## 2012-07-09 DIAGNOSIS — Z8619 Personal history of other infectious and parasitic diseases: Secondary | ICD-10-CM | POA: Insufficient documentation

## 2012-07-09 DIAGNOSIS — I10 Essential (primary) hypertension: Secondary | ICD-10-CM

## 2012-07-10 ENCOUNTER — Encounter: Payer: Self-pay | Admitting: Adult Health

## 2012-07-10 ENCOUNTER — Ambulatory Visit (INDEPENDENT_AMBULATORY_CARE_PROVIDER_SITE_OTHER): Payer: Managed Care, Other (non HMO) | Admitting: Adult Health

## 2012-07-10 VITALS — BP 120/82 | Ht 66.0 in | Wt 168.0 lb

## 2012-07-10 DIAGNOSIS — Z3201 Encounter for pregnancy test, result positive: Secondary | ICD-10-CM

## 2012-07-10 DIAGNOSIS — Z3490 Encounter for supervision of normal pregnancy, unspecified, unspecified trimester: Secondary | ICD-10-CM

## 2012-07-10 LAB — POCT URINE PREGNANCY: Preg Test, Ur: POSITIVE

## 2012-07-10 MED ORDER — PRENATAL PLUS 27-1 MG PO TABS: 1.0000 | ORAL_TABLET | Freq: Every day | ORAL | Status: DC

## 2012-07-10 NOTE — Patient Instructions (Addendum)
Start prenatal vitamins  Will discuss labs in am  Sign up for my chart Will schedule appt. After labs back

## 2012-07-10 NOTE — Progress Notes (Signed)
Subjective:     Patient ID: Julia [name], female   DOB: 09-16-1985, 27 y.o.   MRN: [id number]  HPI Julia [name] is a 27 year old black female in today complaining of breast tenderness and last period the end of March. She thinks she may be pregnant.  Review of Systems Patient denies any headaches, blurred vision, shortness of breath, chest pain, abdominal pain, problems with bowel movements, urination, or intercourse. She has breast tenderness and her period has not come on for this month yet.    Objective:   Physical Exam Blood pressure 120/82, height 5\' 6"  (1.676 m), weight 168 lb (76.204 kg), last menstrual period 06/10/2012. Positive urine pregnancy test. Had miscarriage last year.      Assessment:      Pregnant    Plan:     Rx. Prenatal vitamins Check QHCG and progesterone level and follow up in am, will schedule next appointment after labs back.

## 2012-07-11 ENCOUNTER — Telehealth: Payer: Self-pay | Admitting: Adult Health

## 2012-07-11 LAB — HCG, QUANTITATIVE, PREGNANCY: hCG, Beta Chain, Quant, S: 8260.2 m[IU]/mL

## 2012-07-11 NOTE — Telephone Encounter (Signed)
Left message that numbers look  Good will talk to her on Monday

## 2012-07-29 ENCOUNTER — Other Ambulatory Visit: Payer: Self-pay | Admitting: Obstetrics & Gynecology

## 2012-07-29 DIAGNOSIS — O3680X0 Pregnancy with inconclusive fetal viability, not applicable or unspecified: Secondary | ICD-10-CM

## 2012-08-07 ENCOUNTER — Other Ambulatory Visit: Payer: Self-pay | Admitting: Obstetrics & Gynecology

## 2012-08-07 ENCOUNTER — Ambulatory Visit (INDEPENDENT_AMBULATORY_CARE_PROVIDER_SITE_OTHER): Payer: Managed Care, Other (non HMO)

## 2012-08-07 DIAGNOSIS — O10011 Pre-existing essential hypertension complicating pregnancy, first trimester: Secondary | ICD-10-CM

## 2012-08-07 DIAGNOSIS — O3680X0 Pregnancy with inconclusive fetal viability, not applicable or unspecified: Secondary | ICD-10-CM

## 2012-08-07 DIAGNOSIS — O10019 Pre-existing essential hypertension complicating pregnancy, unspecified trimester: Secondary | ICD-10-CM

## 2012-08-07 DIAGNOSIS — O30009 Twin pregnancy, unspecified number of placenta and unspecified number of amniotic sacs, unspecified trimester: Secondary | ICD-10-CM

## 2012-08-07 DIAGNOSIS — O09299 Supervision of pregnancy with other poor reproductive or obstetric history, unspecified trimester: Secondary | ICD-10-CM

## 2012-08-07 NOTE — Progress Notes (Addendum)
U/S-TWINS! Monochorionic/Diamniotic Twin IUP with +FCA noted x2, CRL x2 c/w 9+1wks EDD 03/11/2013, cx long and closed, bilateral adnexa wnl, 2 amnions and 2 yolk sacs noted

## 2012-08-13 LAB — US OB COMP LESS 14 WKS

## 2012-08-18 ENCOUNTER — Telehealth: Payer: Self-pay | Admitting: Adult Health

## 2012-08-18 ENCOUNTER — Ambulatory Visit (INDEPENDENT_AMBULATORY_CARE_PROVIDER_SITE_OTHER): Payer: Managed Care, Other (non HMO) | Admitting: Obstetrics and Gynecology

## 2012-08-18 ENCOUNTER — Other Ambulatory Visit (HOSPITAL_COMMUNITY)
Admission: RE | Admit: 2012-08-18 | Discharge: 2012-08-18 | Disposition: A | Discharge status: Home / Self Care | Payer: Medicaid Other | Source: Ambulatory Visit | Attending: Obstetrics and Gynecology | Admitting: Obstetrics and Gynecology

## 2012-08-18 VITALS — BP 144/96 | Wt 166.8 lb

## 2012-08-18 DIAGNOSIS — O30001 Twin pregnancy, unspecified number of placenta and unspecified number of amniotic sacs, first trimester: Secondary | ICD-10-CM | POA: Insufficient documentation

## 2012-08-18 DIAGNOSIS — Z01419 Encounter for gynecological examination (general) (routine) without abnormal findings: Secondary | ICD-10-CM

## 2012-08-18 DIAGNOSIS — O23591 Infection of other part of genital tract in pregnancy, first trimester: Secondary | ICD-10-CM | POA: Insufficient documentation

## 2012-08-18 DIAGNOSIS — Z331 Pregnant state, incidental: Secondary | ICD-10-CM

## 2012-08-18 DIAGNOSIS — O2390 Unspecified genitourinary tract infection in pregnancy, unspecified trimester: Secondary | ICD-10-CM

## 2012-08-18 DIAGNOSIS — A5901 Trichomonal vulvovaginitis: Secondary | ICD-10-CM

## 2012-08-18 DIAGNOSIS — Z1389 Encounter for screening for other disorder: Secondary | ICD-10-CM

## 2012-08-18 DIAGNOSIS — N898 Other specified noninflammatory disorders of vagina: Secondary | ICD-10-CM

## 2012-08-18 LAB — POCT URINALYSIS DIPSTICK
Ketones, UA: NEGATIVE
Protein, UA: NEGATIVE

## 2012-08-18 LAB — POCT WET PREP (WET MOUNT)

## 2012-08-18 MED ORDER — METHYLDOPA 500 MG PO TABS: 500.0000 mg | ORAL_TABLET | Freq: Two times a day (BID) | ORAL | Status: DC

## 2012-08-18 MED ORDER — METRONIDAZOLE 500 MG PO TABS: 500.0000 mg | ORAL_TABLET | Freq: Two times a day (BID) | ORAL | Status: DC

## 2012-08-18 NOTE — Progress Notes (Signed)
C/o elevated blood pressures and irritation and itching with discharge.

## 2012-08-18 NOTE — Telephone Encounter (Signed)
BP up taking aldomet 500 mg 2 bid  Needs to be seen today to be here at 2:30 pm to check BP and adjust meds.

## 2012-08-18 NOTE — Progress Notes (Signed)
C/o vag dischg;  Wet prep:_+ Trich________, gc/chl collected, PAP done Rx Flagyl 2 gm pt and partner

## 2012-08-18 NOTE — Patient Instructions (Addendum)
Increase your aldomet to 2 tablets twiche daily. Take your flagyl twice daily., both you and partner

## 2012-08-27 ENCOUNTER — Other Ambulatory Visit: Payer: Self-pay | Admitting: Obstetrics and Gynecology

## 2012-08-28 ENCOUNTER — Ambulatory Visit (INDEPENDENT_AMBULATORY_CARE_PROVIDER_SITE_OTHER): Payer: Medicaid Other | Admitting: Advanced Practice Midwife

## 2012-08-28 ENCOUNTER — Encounter: Payer: Self-pay | Admitting: Advanced Practice Midwife

## 2012-08-28 VITALS — BP 116/80 | Wt 168.0 lb

## 2012-08-28 DIAGNOSIS — Z1389 Encounter for screening for other disorder: Secondary | ICD-10-CM

## 2012-08-28 DIAGNOSIS — O09299 Supervision of pregnancy with other poor reproductive or obstetric history, unspecified trimester: Secondary | ICD-10-CM

## 2012-08-28 DIAGNOSIS — O0991 Supervision of high risk pregnancy, unspecified, first trimester: Secondary | ICD-10-CM

## 2012-08-28 DIAGNOSIS — I10 Essential (primary) hypertension: Secondary | ICD-10-CM

## 2012-08-28 DIAGNOSIS — Z331 Pregnant state, incidental: Secondary | ICD-10-CM

## 2012-08-28 LAB — POCT URINALYSIS DIPSTICK
Blood, UA: NEGATIVE
Ketones, UA: NEGATIVE

## 2012-08-28 LAB — CBC
HCT: 36.8 % (ref 36.0–46.0)
Hemoglobin: 12.3 g/dL (ref 12.0–15.0)
MCH: 29.9 pg (ref 26.0–34.0)
MCHC: 33.4 g/dL (ref 30.0–36.0)
RDW: 13.1 % (ref 11.5–15.5)

## 2012-08-28 NOTE — Progress Notes (Signed)
Pain in left leg. When she wiped on Monday pm, she noticed a pink color on toilet paper. None since then. New OB packet given. Consents signed.

## 2012-08-28 NOTE — Progress Notes (Signed)
  Subjective:    Julia [name] is a [id number] [redacted]w[redacted]d being seen today for her first obstetrical visit.  Her obstetrical history is significant for pre-eclampsia and A!DM.  Pregnancy history fully reviewed.  Patient reports no complaints.  Filed Vitals:   08/28/12 1354  BP: 116/80  Weight: 168 lb (76.204 kg)    HISTORY: OB History   Grav Para Term Preterm Abortions TAB SAB Ect Mult Living   5 1  1 3  3   1      # Outc Date GA Lbr Len/2nd Wgt Sex Del Anes PTL Lv   1 PRE 2008 [redacted]w[redacted]d  5lb13oz(2.637kg) M SVD EPI  Yes   Comments: IOL fo rpreeclampsia.  Had A1DM   2 SAB            3 SAB            4 SAB            5 CUR              Past Medical History  Diagnosis Date  . Preeclampsia   . Hx of trichomoniasis   . Gestational diabetes    Past Surgical History  Procedure Laterality Date  . Breast surgery     Family History  Problem Relation Age of Onset  . Hypertension Mother   . Cancer Maternal Grandmother      Exam       Pelvic Exam:    Perineum: Normal Perineum   Vulva: normal   Vagina:  normal mucosa, normal discharge,no blood, no palpable nodules   Uterus         Cervix: normal   Adnexa: Not palpable   Urinary:  urethral meatus normal    System: Breast:  normal appearance, no masses or tenderness   Skin: normal coloration and turgor, no rashes    Neurologic: oriented, normal, normal mood   Extremities: normal strength, tone, and muscle mass   HEENT PERRLA   Mouth/Teeth mucous membranes moist, pharynx normal without lesions   Neck supple and no masses   Cardiovascular: regular rate and rhythm   Respiratory:  appears well, vitals normal, no respiratory distress, acyanotic, normal RR   Abdomen: soft, non-tender; bowel sounds normal; no masses,  no organomegaly          Assessment:    Pregnancy: [id number] Patient Active Problem List   Diagnosis Date Noted  . Twin pregnancy in first trimester monochorionic diamniotic 08/18/2012  . Trichomonal vaginitis  in pregnancy in first trimester 08/18/2012  . HTN (hypertension) 07/09/2012  . Hx of trichomoniasis 07/09/2012        Plan:     Initial labs drawn. Prenatal vitamins. Problem list reviewed and updated. Genetic Screening discussed First Screen: requested.  Ultrasound discussed; fetal survey: requested.  Follow up in asap for Nt/IT  CRESENZO-DISHMAN,Linna Thebeau 08/28/2012     Just took Flagyl 5 days ago, so will not POC today.  Has CHTN on meds. Baseline 24 hour urine for TP ordered.

## 2012-08-29 LAB — DRUG SCREEN, URINE, NO CONFIRMATION
Benzodiazepines.: NEGATIVE
Creatinine,U: 111.5 mg/dL
Opiate Screen, Urine: NEGATIVE
Phencyclidine (PCP): NEGATIVE
Propoxyphene: NEGATIVE

## 2012-08-29 LAB — RUBELLA SCREEN: Rubella: 1.93 Index — ABNORMAL HIGH (ref <0.90)

## 2012-08-29 LAB — URINALYSIS
Bilirubin Urine: NEGATIVE
Glucose, UA: NEGATIVE mg/dL
Hgb urine dipstick: NEGATIVE
Leukocytes, UA: NEGATIVE
Protein, ur: NEGATIVE mg/dL
pH: 8 (ref 5.0–8.0)

## 2012-08-29 LAB — HEPATITIS B SURFACE ANTIGEN: Hepatitis B Surface Ag: NEGATIVE

## 2012-08-29 LAB — HIV ANTIBODY (ROUTINE TESTING W REFLEX): HIV: NONREACTIVE

## 2012-08-29 LAB — RPR

## 2012-08-29 LAB — VARICELLA ZOSTER ANTIBODY, IGG: Varicella IgG: 1941 Index — ABNORMAL HIGH (ref <135.00)

## 2012-08-30 LAB — URINE CULTURE
Colony Count: NO GROWTH
Organism ID, Bacteria: NO GROWTH

## 2012-09-01 ENCOUNTER — Other Ambulatory Visit: Payer: Self-pay | Admitting: Obstetrics & Gynecology

## 2012-09-01 DIAGNOSIS — Z36 Encounter for antenatal screening of mother: Secondary | ICD-10-CM

## 2012-09-01 DIAGNOSIS — O30009 Twin pregnancy, unspecified number of placenta and unspecified number of amniotic sacs, unspecified trimester: Secondary | ICD-10-CM

## 2012-09-01 LAB — CYSTIC FIBROSIS DIAGNOSTIC STUDY

## 2012-09-02 ENCOUNTER — Other Ambulatory Visit: Payer: Managed Care, Other (non HMO)

## 2012-09-02 ENCOUNTER — Other Ambulatory Visit: Payer: Self-pay | Admitting: Obstetrics & Gynecology

## 2012-09-02 ENCOUNTER — Ambulatory Visit (INDEPENDENT_AMBULATORY_CARE_PROVIDER_SITE_OTHER): Payer: Managed Care, Other (non HMO)

## 2012-09-02 DIAGNOSIS — O1002 Pre-existing essential hypertension complicating childbirth: Secondary | ICD-10-CM

## 2012-09-02 DIAGNOSIS — O30009 Twin pregnancy, unspecified number of placenta and unspecified number of amniotic sacs, unspecified trimester: Secondary | ICD-10-CM

## 2012-09-02 DIAGNOSIS — Z1389 Encounter for screening for other disorder: Secondary | ICD-10-CM

## 2012-09-02 DIAGNOSIS — Z36 Encounter for antenatal screening of mother: Secondary | ICD-10-CM

## 2012-09-02 NOTE — Progress Notes (Signed)
U/S(12wks)-Mono/Di twins, cx long and closed 3.8cm, baby A- CRL c/w u/s dates, NB present, NT=1.2 mm, FHR=156BPM,Baby B-CRL c/w u/s dates, NB present, NT=1.4 mm, FHR=140 BPM

## 2012-09-03 LAB — PROTEIN, URINE, 24 HOUR: Protein, 24H Urine: 84 mg/d (ref 50–100)

## 2012-09-25 ENCOUNTER — Other Ambulatory Visit: Payer: Self-pay | Admitting: Advanced Practice Midwife

## 2012-09-25 ENCOUNTER — Ambulatory Visit (INDEPENDENT_AMBULATORY_CARE_PROVIDER_SITE_OTHER): Payer: Managed Care, Other (non HMO) | Admitting: Advanced Practice Midwife

## 2012-09-25 ENCOUNTER — Encounter: Payer: Self-pay | Admitting: Advanced Practice Midwife

## 2012-09-25 VITALS — BP 120/80 | Wt 172.0 lb

## 2012-09-25 DIAGNOSIS — I10 Essential (primary) hypertension: Secondary | ICD-10-CM

## 2012-09-25 DIAGNOSIS — O09299 Supervision of pregnancy with other poor reproductive or obstetric history, unspecified trimester: Secondary | ICD-10-CM

## 2012-09-25 DIAGNOSIS — O10019 Pre-existing essential hypertension complicating pregnancy, unspecified trimester: Secondary | ICD-10-CM

## 2012-09-25 DIAGNOSIS — Z1389 Encounter for screening for other disorder: Secondary | ICD-10-CM

## 2012-09-25 DIAGNOSIS — Z331 Pregnant state, incidental: Secondary | ICD-10-CM

## 2012-09-25 DIAGNOSIS — O30009 Twin pregnancy, unspecified number of placenta and unspecified number of amniotic sacs, unspecified trimester: Secondary | ICD-10-CM

## 2012-09-25 DIAGNOSIS — O2390 Unspecified genitourinary tract infection in pregnancy, unspecified trimester: Secondary | ICD-10-CM

## 2012-09-25 DIAGNOSIS — A5901 Trichomonal vulvovaginitis: Secondary | ICD-10-CM

## 2012-09-25 DIAGNOSIS — O30001 Twin pregnancy, unspecified number of placenta and unspecified number of amniotic sacs, first trimester: Secondary | ICD-10-CM

## 2012-09-25 LAB — POCT URINALYSIS DIPSTICK
Leukocytes, UA: NEGATIVE
Nitrite, UA: NEGATIVE
Protein, UA: NEGATIVE

## 2012-09-25 NOTE — Progress Notes (Signed)
IT #2 today. POC for trich sent.    No c/o at this time.  Routine questions about pregnancy answered.  F/U in 3 weeks for anatomy scan.

## 2012-09-28 LAB — MATERNAL SCREEN, INTEGRATED #2
AFP, Serum: 87 ng/mL
Calculated Gestational Age: 16.3
Crown Rump Length: 65.7 mm
NT MoM: 0.81
hCG MoM: 2.17
hCG, Serum: 53.2 IU/mL

## 2012-10-16 ENCOUNTER — Other Ambulatory Visit: Payer: Managed Care, Other (non HMO)

## 2012-10-16 ENCOUNTER — Ambulatory Visit (INDEPENDENT_AMBULATORY_CARE_PROVIDER_SITE_OTHER): Payer: Managed Care, Other (non HMO) | Admitting: Advanced Practice Midwife

## 2012-10-16 ENCOUNTER — Encounter: Payer: Self-pay | Admitting: Advanced Practice Midwife

## 2012-10-16 VITALS — BP 130/80 | Wt 175.0 lb

## 2012-10-16 DIAGNOSIS — O30001 Twin pregnancy, unspecified number of placenta and unspecified number of amniotic sacs, first trimester: Secondary | ICD-10-CM

## 2012-10-16 DIAGNOSIS — O10019 Pre-existing essential hypertension complicating pregnancy, unspecified trimester: Secondary | ICD-10-CM

## 2012-10-16 DIAGNOSIS — O23591 Infection of other part of genital tract in pregnancy, first trimester: Secondary | ICD-10-CM

## 2012-10-16 DIAGNOSIS — Z1389 Encounter for screening for other disorder: Secondary | ICD-10-CM

## 2012-10-16 DIAGNOSIS — O09299 Supervision of pregnancy with other poor reproductive or obstetric history, unspecified trimester: Secondary | ICD-10-CM

## 2012-10-16 DIAGNOSIS — O09899 Supervision of other high risk pregnancies, unspecified trimester: Secondary | ICD-10-CM

## 2012-10-16 DIAGNOSIS — O2390 Unspecified genitourinary tract infection in pregnancy, unspecified trimester: Secondary | ICD-10-CM

## 2012-10-16 DIAGNOSIS — O30009 Twin pregnancy, unspecified number of placenta and unspecified number of amniotic sacs, unspecified trimester: Secondary | ICD-10-CM

## 2012-10-16 LAB — POCT URINALYSIS DIPSTICK
Glucose, UA: NEGATIVE
Leukocytes, UA: NEGATIVE
Nitrite, UA: NEGATIVE

## 2012-10-16 NOTE — Progress Notes (Signed)
HAD VAGINAL DISCHARGE OFF AND ON, WHITE IN COLOR . NO DISCHARGE TODAY.

## 2012-10-16 NOTE — Addendum Note (Signed)
Addended by: Jacklyn Shell on: 10/16/2012 01:59 PM   Modules accepted: Orders

## 2012-10-16 NOTE — Progress Notes (Signed)
Was 30 minutres late for anatomy scan, so rescheduled.   Normal vaginal discharge, just more.  Routine questions about pregnancy answered.  F/U asap for anatomy scan and in 4 weeks for LROB.

## 2012-10-16 NOTE — Addendum Note (Signed)
Addended by: Richardson Chiquito on: 10/16/2012 02:07 PM   Modules accepted: Orders

## 2012-10-22 ENCOUNTER — Other Ambulatory Visit: Payer: Self-pay | Admitting: Advanced Practice Midwife

## 2012-10-22 ENCOUNTER — Other Ambulatory Visit: Payer: Self-pay | Admitting: Obstetrics & Gynecology

## 2012-10-22 ENCOUNTER — Ambulatory Visit (INDEPENDENT_AMBULATORY_CARE_PROVIDER_SITE_OTHER): Payer: Managed Care, Other (non HMO)

## 2012-10-22 DIAGNOSIS — O30001 Twin pregnancy, unspecified number of placenta and unspecified number of amniotic sacs, first trimester: Secondary | ICD-10-CM

## 2012-10-22 DIAGNOSIS — O30032 Twin pregnancy, monochorionic/diamniotic, second trimester: Secondary | ICD-10-CM

## 2012-10-22 DIAGNOSIS — O30009 Twin pregnancy, unspecified number of placenta and unspecified number of amniotic sacs, unspecified trimester: Secondary | ICD-10-CM

## 2012-10-22 DIAGNOSIS — O30039 Twin pregnancy, monochorionic/diamniotic, unspecified trimester: Secondary | ICD-10-CM

## 2012-10-22 NOTE — Progress Notes (Signed)
U/S(20+0wks)-mono/di twins, all ant gr 0 plac, membrane noted , Baby A on maternal LT side and Baby B on maternal RT side, cx long and closed, bilateral adnexa WNL Baby A- breech active fetus, meas c/w dates, fluid WNL, no major abnl noted although unable to view baby heels due to fetal position, female fetus Baby B- breech active fetus, meas c/w dates, fluid WNL, no major abnl noted although unable to view baby heels due to fetal position, female fetus Would like to reck anat at next u/s

## 2012-10-22 NOTE — Progress Notes (Signed)
U/S(20+0wks)- twins, mono/di Baby A

## 2012-11-18 ENCOUNTER — Ambulatory Visit (INDEPENDENT_AMBULATORY_CARE_PROVIDER_SITE_OTHER): Payer: Managed Care, Other (non HMO) | Admitting: Obstetrics & Gynecology

## 2012-11-18 ENCOUNTER — Telehealth: Payer: Self-pay | Admitting: Obstetrics and Gynecology

## 2012-11-18 ENCOUNTER — Encounter: Payer: Self-pay | Admitting: Obstetrics & Gynecology

## 2012-11-18 VITALS — BP 126/80 | Wt 180.0 lb

## 2012-11-18 DIAGNOSIS — Z1389 Encounter for screening for other disorder: Secondary | ICD-10-CM

## 2012-11-18 LAB — POCT URINALYSIS DIPSTICK
Blood, UA: NEGATIVE
Ketones, UA: 3
Nitrite, UA: NEGATIVE
Protein, UA: NEGATIVE

## 2012-11-18 NOTE — Telephone Encounter (Signed)
Spoke with pt. Pt was wondering if we done amino testing here. I told her we didn't. Pt states she is having issues with the babies father. She feels depressed, wants to be taken out of work. She states she can't function during the day. I asked her if she was suicidal and she said no but it was with hesitation. Pt to be worked in this afternoon at 2:30. JSY

## 2012-11-18 NOTE — Progress Notes (Signed)
Both babies moving well.  No other pregnancy complaints.  Really having trouble with the supposed FOB, there is a question of paternity.  Not living together, not since beginning of April.  No indication for pharmaceutical therapy BP weight and urine results all reviewed and noted. Patient reports good fetal movement, denies any bleeding and no rupture of membranes symptoms or regular contractions. Patient is without complaints. All questions were answered.

## 2012-11-20 ENCOUNTER — Ambulatory Visit (INDEPENDENT_AMBULATORY_CARE_PROVIDER_SITE_OTHER): Payer: Managed Care, Other (non HMO) | Admitting: Advanced Practice Midwife

## 2012-11-20 ENCOUNTER — Ambulatory Visit (INDEPENDENT_AMBULATORY_CARE_PROVIDER_SITE_OTHER): Payer: Managed Care, Other (non HMO)

## 2012-11-20 ENCOUNTER — Other Ambulatory Visit: Payer: Self-pay | Admitting: Obstetrics & Gynecology

## 2012-11-20 ENCOUNTER — Other Ambulatory Visit: Payer: Self-pay | Admitting: Advanced Practice Midwife

## 2012-11-20 VITALS — BP 122/72 | Wt 182.0 lb

## 2012-11-20 DIAGNOSIS — O09899 Supervision of other high risk pregnancies, unspecified trimester: Secondary | ICD-10-CM

## 2012-11-20 DIAGNOSIS — O26872 Cervical shortening, second trimester: Secondary | ICD-10-CM

## 2012-11-20 DIAGNOSIS — O10019 Pre-existing essential hypertension complicating pregnancy, unspecified trimester: Secondary | ICD-10-CM

## 2012-11-20 DIAGNOSIS — O09299 Supervision of pregnancy with other poor reproductive or obstetric history, unspecified trimester: Secondary | ICD-10-CM

## 2012-11-20 DIAGNOSIS — O30009 Twin pregnancy, unspecified number of placenta and unspecified number of amniotic sacs, unspecified trimester: Secondary | ICD-10-CM

## 2012-11-20 DIAGNOSIS — O09219 Supervision of pregnancy with history of pre-term labor, unspecified trimester: Secondary | ICD-10-CM

## 2012-11-20 DIAGNOSIS — Z3482 Encounter for supervision of other normal pregnancy, second trimester: Secondary | ICD-10-CM

## 2012-11-20 DIAGNOSIS — Z1389 Encounter for screening for other disorder: Secondary | ICD-10-CM

## 2012-11-20 DIAGNOSIS — O26879 Cervical shortening, unspecified trimester: Secondary | ICD-10-CM

## 2012-11-20 DIAGNOSIS — O30001 Twin pregnancy, unspecified number of placenta and unspecified number of amniotic sacs, first trimester: Secondary | ICD-10-CM

## 2012-11-20 DIAGNOSIS — Z331 Pregnant state, incidental: Secondary | ICD-10-CM

## 2012-11-20 LAB — POCT URINALYSIS DIPSTICK
Ketones, UA: NEGATIVE
Leukocytes, UA: NEGATIVE

## 2012-11-20 MED ORDER — PROGESTERONE MICRONIZED 200 MG PO CAPS: 200.0000 mg | ORAL_CAPSULE | Freq: Every day | ORAL | Status: DC

## 2012-11-20 NOTE — Progress Notes (Signed)
See ultrasound report.  Denies cramps/contractions.  Started on prometrium 200mg  PV.  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  Will allow to continue to work since she rests the whole time.  F/u 3 weeks for cx length/PN2/growth [place]

## 2012-11-20 NOTE — Patient Instructions (Addendum)
Nothing to eat or drink after midnight before your next appointment & plan to be here for 2 hours (for your sugar test).  

## 2012-11-20 NOTE — Progress Notes (Signed)
U/S(24+1wks)-Twin IUP, ant gr 1 placenta, CX=2.2cm  With small amount of funneling noted (transvaginally) Funneling=1.9cm in length and from 1.5 to 1.3cm in width  Baby A- breech, active fetus, approp growth, EFW 1 lb 10 oz (57th%tile), fluid wnl Pocket noted greater than 2cm, bilateral heels seen well today, female fetus "Julia [name]" Baby B- vtx active fetus, approp growth EFw 1 lb 7 oz (44th%tile), fluid wnl pocket noted greater than 2 cm, bilateral heels seen well today, female fetus "Julia [name]" EFW discordance=10%

## 2012-12-11 ENCOUNTER — Other Ambulatory Visit: Payer: Managed Care, Other (non HMO)

## 2012-12-11 ENCOUNTER — Other Ambulatory Visit: Payer: Self-pay | Admitting: Obstetrics & Gynecology

## 2012-12-11 ENCOUNTER — Ambulatory Visit (INDEPENDENT_AMBULATORY_CARE_PROVIDER_SITE_OTHER): Payer: Managed Care, Other (non HMO)

## 2012-12-11 ENCOUNTER — Encounter: Payer: Managed Care, Other (non HMO) | Admitting: Obstetrics & Gynecology

## 2012-12-11 ENCOUNTER — Other Ambulatory Visit: Payer: Self-pay | Admitting: Advanced Practice Midwife

## 2012-12-11 ENCOUNTER — Ambulatory Visit (INDEPENDENT_AMBULATORY_CARE_PROVIDER_SITE_OTHER): Payer: Managed Care, Other (non HMO) | Admitting: Obstetrics & Gynecology

## 2012-12-11 VITALS — BP 128/70 | Wt 182.5 lb

## 2012-12-11 DIAGNOSIS — O30009 Twin pregnancy, unspecified number of placenta and unspecified number of amniotic sacs, unspecified trimester: Secondary | ICD-10-CM

## 2012-12-11 DIAGNOSIS — Z3480 Encounter for supervision of other normal pregnancy, unspecified trimester: Secondary | ICD-10-CM

## 2012-12-11 DIAGNOSIS — O09219 Supervision of pregnancy with history of pre-term labor, unspecified trimester: Secondary | ICD-10-CM

## 2012-12-11 DIAGNOSIS — O26872 Cervical shortening, second trimester: Secondary | ICD-10-CM

## 2012-12-11 DIAGNOSIS — O10012 Pre-existing essential hypertension complicating pregnancy, second trimester: Secondary | ICD-10-CM

## 2012-12-11 DIAGNOSIS — Z331 Pregnant state, incidental: Secondary | ICD-10-CM

## 2012-12-11 DIAGNOSIS — O10019 Pre-existing essential hypertension complicating pregnancy, unspecified trimester: Secondary | ICD-10-CM

## 2012-12-11 DIAGNOSIS — O0993 Supervision of high risk pregnancy, unspecified, third trimester: Secondary | ICD-10-CM

## 2012-12-11 DIAGNOSIS — O26879 Cervical shortening, unspecified trimester: Secondary | ICD-10-CM

## 2012-12-11 DIAGNOSIS — R768 Other specified abnormal immunological findings in serum: Secondary | ICD-10-CM

## 2012-12-11 DIAGNOSIS — O30001 Twin pregnancy, unspecified number of placenta and unspecified number of amniotic sacs, first trimester: Secondary | ICD-10-CM

## 2012-12-11 DIAGNOSIS — O09899 Supervision of other high risk pregnancies, unspecified trimester: Secondary | ICD-10-CM

## 2012-12-11 DIAGNOSIS — Z1389 Encounter for screening for other disorder: Secondary | ICD-10-CM

## 2012-12-11 DIAGNOSIS — O24410 Gestational diabetes mellitus in pregnancy, diet controlled: Secondary | ICD-10-CM

## 2012-12-11 DIAGNOSIS — Z3482 Encounter for supervision of other normal pregnancy, second trimester: Secondary | ICD-10-CM

## 2012-12-11 LAB — POCT URINALYSIS DIPSTICK: Protein, UA: NEGATIVE

## 2012-12-11 LAB — CBC
HCT: 33.9 % — ABNORMAL LOW (ref 36.0–46.0)
Hemoglobin: 11.3 g/dL — ABNORMAL LOW (ref 12.0–15.0)
WBC: 9.1 10*3/uL (ref 4.0–10.5)

## 2012-12-11 MED ORDER — BETAMETHASONE SOD PHOS & ACET 6 (3-3) MG/ML IJ SUSP
12.0000 mg | Freq: Once | INTRAMUSCULAR | Status: AC
  Administered 2012-12-11: 12 mg via INTRAMUSCULAR

## 2012-12-11 MED ORDER — NIFEDIPINE ER OSMOTIC RELEASE 30 MG PO TB24: 30.0000 mg | ORAL_TABLET | Freq: Every day | ORAL | Status: DC

## 2012-12-11 NOTE — Progress Notes (Signed)
U/S(27+1wks)-twin IUP CX=0.50mm with funneling noted, membrane noted near external cervical os, all anterior plac Gr 1, membrane noted Baby A- breech, active fetus, approp growth EFW 2 lb 10 oz (68th%tile), fluid wnl Single Deepest Pocket noted=3.2cm, female fetus "Julia [name]", UA Doppler RI=0.62 Baby B-vtx, active fetus, approp growth EFW 2 lb 4 oz (51st%tile), fluid wnl Single Deepest Pocket noted=3.2cm, female fetus "Julia [name]", UA Doppler RI=0.66

## 2012-12-11 NOTE — Addendum Note (Signed)
Addended by: Criss Alvine on: 12/11/2012 10:40 AM   Modules accepted: Orders

## 2012-12-11 NOTE — Progress Notes (Signed)
No complaints at this time. BMZ given today

## 2012-12-11 NOTE — Progress Notes (Signed)
[place] had her sonogram please see the report for details.  The babies are doing fine with good growth and they are concordant However her cervix has significantly shortened from 1.9 cm to now a half centimeter.  She has been on Prometrium 200 mg per vagina since 22 week and she will continue that.  She is started on Procardia XL 30 mg today, given her instructions to stagger it with her Aldomet so as not to unduly drop her blood pressure He is also given betamethasone 12 mg IM today and will come back in March for a repeat dose.  She is taken out of work and placed I'm firm bed rest. We'll follow her up in one week to see how she is doing and she is given instructions to go to the [facility] in [place] should she have any significant uterine activity or other issues.

## 2012-12-12 ENCOUNTER — Ambulatory Visit (INDEPENDENT_AMBULATORY_CARE_PROVIDER_SITE_OTHER): Payer: Managed Care, Other (non HMO) | Admitting: Obstetrics & Gynecology

## 2012-12-12 ENCOUNTER — Encounter: Payer: Self-pay | Admitting: Obstetrics & Gynecology

## 2012-12-12 VITALS — BP 138/80 | Ht 66.0 in | Wt 189.0 lb

## 2012-12-12 DIAGNOSIS — Z1389 Encounter for screening for other disorder: Secondary | ICD-10-CM

## 2012-12-12 DIAGNOSIS — O30009 Twin pregnancy, unspecified number of placenta and unspecified number of amniotic sacs, unspecified trimester: Secondary | ICD-10-CM

## 2012-12-12 DIAGNOSIS — O30001 Twin pregnancy, unspecified number of placenta and unspecified number of amniotic sacs, first trimester: Secondary | ICD-10-CM

## 2012-12-12 DIAGNOSIS — Z331 Pregnant state, incidental: Secondary | ICD-10-CM

## 2012-12-12 DIAGNOSIS — O10019 Pre-existing essential hypertension complicating pregnancy, unspecified trimester: Secondary | ICD-10-CM

## 2012-12-12 DIAGNOSIS — O09219 Supervision of pregnancy with history of pre-term labor, unspecified trimester: Secondary | ICD-10-CM

## 2012-12-12 DIAGNOSIS — O09899 Supervision of other high risk pregnancies, unspecified trimester: Secondary | ICD-10-CM

## 2012-12-12 DIAGNOSIS — Z131 Encounter for screening for diabetes mellitus: Secondary | ICD-10-CM

## 2012-12-12 DIAGNOSIS — R81 Glycosuria: Secondary | ICD-10-CM

## 2012-12-12 DIAGNOSIS — O26872 Cervical shortening, second trimester: Secondary | ICD-10-CM

## 2012-12-12 LAB — POCT URINALYSIS DIPSTICK
Blood, UA: NEGATIVE
Ketones, UA: NEGATIVE
Protein, UA: NEGATIVE

## 2012-12-12 LAB — GLUCOSE, POCT (MANUAL RESULT ENTRY): POC Glucose: 118 mg/dl — AB (ref 70–99)

## 2012-12-12 LAB — GLUCOSE TOLERANCE, 2 HOURS W/ 1HR
Glucose, 2 hour: 182 mg/dL — ABNORMAL HIGH (ref 70–139)
Glucose, Fasting: 89 mg/dL (ref 70–99)

## 2012-12-12 LAB — HSV 2 ANTIBODY, IGG: HSV 2 Glycoprotein G Ab, IgG: 7.17 IV — ABNORMAL HIGH

## 2012-12-12 LAB — ANTIBODY SCREEN: Antibody Screen: NEGATIVE

## 2012-12-12 MED ORDER — BETAMETHASONE SOD PHOS & ACET 6 (3-3) MG/ML IJ SUSP
12.0000 mg | Freq: Once | INTRAMUSCULAR | Status: AC
  Administered 2012-12-12: 12 mg via INTRAMUSCULAR

## 2012-12-13 LAB — OB RESULTS CONSOLE GC/CHLAMYDIA: Gonorrhea: NEGATIVE

## 2012-12-16 DIAGNOSIS — R768 Other specified abnormal immunological findings in serum: Secondary | ICD-10-CM | POA: Insufficient documentation

## 2012-12-16 DIAGNOSIS — O24410 Gestational diabetes mellitus in pregnancy, diet controlled: Secondary | ICD-10-CM | POA: Insufficient documentation

## 2012-12-16 NOTE — Progress Notes (Signed)
Left pt a message that she did not pass her sugar test.  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  Nutritional consult ordered and pt to call office to be seen in 2 weeks to check blood sugars.

## 2012-12-16 NOTE — Addendum Note (Signed)
Addended by: Jacklyn Shell on: 12/16/2012 10:14 AM   Modules accepted: Orders

## 2012-12-18 ENCOUNTER — Ambulatory Visit (INDEPENDENT_AMBULATORY_CARE_PROVIDER_SITE_OTHER): Payer: Self-pay | Admitting: Advanced Practice Midwife

## 2012-12-18 ENCOUNTER — Encounter: Payer: Self-pay | Admitting: Advanced Practice Midwife

## 2012-12-18 VITALS — BP 120/76 | Wt 190.0 lb

## 2012-12-18 DIAGNOSIS — O0993 Supervision of high risk pregnancy, unspecified, third trimester: Secondary | ICD-10-CM

## 2012-12-18 DIAGNOSIS — Z1389 Encounter for screening for other disorder: Secondary | ICD-10-CM

## 2012-12-18 DIAGNOSIS — O10019 Pre-existing essential hypertension complicating pregnancy, unspecified trimester: Secondary | ICD-10-CM

## 2012-12-18 DIAGNOSIS — O09219 Supervision of pregnancy with history of pre-term labor, unspecified trimester: Secondary | ICD-10-CM

## 2012-12-18 DIAGNOSIS — O09899 Supervision of other high risk pregnancies, unspecified trimester: Secondary | ICD-10-CM

## 2012-12-18 DIAGNOSIS — O30009 Twin pregnancy, unspecified number of placenta and unspecified number of amniotic sacs, unspecified trimester: Secondary | ICD-10-CM

## 2012-12-18 DIAGNOSIS — O26872 Cervical shortening, second trimester: Secondary | ICD-10-CM

## 2012-12-18 DIAGNOSIS — O26879 Cervical shortening, unspecified trimester: Secondary | ICD-10-CM

## 2012-12-18 LAB — POCT URINALYSIS DIPSTICK
Blood, UA: NEGATIVE
Ketones, UA: NEGATIVE
Nitrite, UA: NEGATIVE

## 2012-12-18 NOTE — Progress Notes (Signed)
Pt counseled about A1DM and + HSV2. Has not heard from nutritionist.  Amy is going to call them and see what's wrong.  Pt had A1DM with last pregnancy, but still wants a refresher course on nutrition.  Doing well on Procardia XL 30mg  qd and Prometrium 200mg  PV qhs.  No contracitons/irritability.  S/P BMZ 9/24-25.  Will f/u 2 weeks for blood sugar log review; 4 weeks for growth u/s/BPP/cx length. Pt aware that this will be the start of her twice weekly testing for HTN/A1DM/twins.  F/U sooner if PTL sx. Call Tuesday if she has not heard from nutritionist by then.

## 2012-12-18 NOTE — Progress Notes (Signed)
Pt here today for routine visit. Pt denies any problems or concerns at this time.  

## 2012-12-23 DIAGNOSIS — Z029 Encounter for administrative examinations, unspecified: Secondary | ICD-10-CM

## 2013-01-01 ENCOUNTER — Ambulatory Visit (INDEPENDENT_AMBULATORY_CARE_PROVIDER_SITE_OTHER): Payer: Self-pay | Admitting: Obstetrics & Gynecology

## 2013-01-01 ENCOUNTER — Encounter: Payer: Self-pay | Admitting: Obstetrics & Gynecology

## 2013-01-01 ENCOUNTER — Encounter (INDEPENDENT_AMBULATORY_CARE_PROVIDER_SITE_OTHER): Payer: Self-pay

## 2013-01-01 VITALS — BP 120/80 | Wt 189.0 lb

## 2013-01-01 DIAGNOSIS — O09219 Supervision of pregnancy with history of pre-term labor, unspecified trimester: Secondary | ICD-10-CM

## 2013-01-01 DIAGNOSIS — O99019 Anemia complicating pregnancy, unspecified trimester: Secondary | ICD-10-CM

## 2013-01-01 DIAGNOSIS — O10019 Pre-existing essential hypertension complicating pregnancy, unspecified trimester: Secondary | ICD-10-CM

## 2013-01-01 DIAGNOSIS — O30009 Twin pregnancy, unspecified number of placenta and unspecified number of amniotic sacs, unspecified trimester: Secondary | ICD-10-CM

## 2013-01-01 DIAGNOSIS — Z331 Pregnant state, incidental: Secondary | ICD-10-CM

## 2013-01-01 DIAGNOSIS — O98519 Other viral diseases complicating pregnancy, unspecified trimester: Secondary | ICD-10-CM

## 2013-01-01 DIAGNOSIS — Z1389 Encounter for screening for other disorder: Secondary | ICD-10-CM

## 2013-01-01 DIAGNOSIS — O09899 Supervision of other high risk pregnancies, unspecified trimester: Secondary | ICD-10-CM

## 2013-01-01 DIAGNOSIS — O99810 Abnormal glucose complicating pregnancy: Secondary | ICD-10-CM

## 2013-01-01 LAB — POCT URINALYSIS DIPSTICK
Blood, UA: NEGATIVE
Glucose, UA: NEGATIVE
Ketones, UA: NEGATIVE
Nitrite, UA: NEGATIVE
Protein, UA: NEGATIVE

## 2013-01-01 NOTE — Addendum Note (Signed)
Addended by: Colen Darling on: 01/01/2013 12:42 PM   Modules accepted: Orders

## 2013-01-01 NOTE — Progress Notes (Signed)
Still has not had appt with dietitician will re order Babies moving well no bleeding no fluid no contractions Taking all her meds

## 2013-01-07 ENCOUNTER — Encounter (HOSPITAL_COMMUNITY): Payer: Self-pay

## 2013-01-07 ENCOUNTER — Inpatient Hospital Stay (HOSPITAL_COMMUNITY): Payer: Managed Care, Other (non HMO)

## 2013-01-07 ENCOUNTER — Inpatient Hospital Stay (HOSPITAL_COMMUNITY)
Admission: AD | Admit: 2013-01-07 | Discharge: 2013-01-07 | Disposition: A | Discharge status: Home / Self Care | Payer: Managed Care, Other (non HMO) | Source: Ambulatory Visit | Attending: Obstetrics & Gynecology | Admitting: Obstetrics & Gynecology

## 2013-01-07 DIAGNOSIS — R109 Unspecified abdominal pain: Secondary | ICD-10-CM | POA: Insufficient documentation

## 2013-01-07 DIAGNOSIS — O30009 Twin pregnancy, unspecified number of placenta and unspecified number of amniotic sacs, unspecified trimester: Secondary | ICD-10-CM | POA: Diagnosis not present

## 2013-01-07 DIAGNOSIS — R768 Other specified abnormal immunological findings in serum: Secondary | ICD-10-CM

## 2013-01-07 DIAGNOSIS — O99810 Abnormal glucose complicating pregnancy: Secondary | ICD-10-CM | POA: Insufficient documentation

## 2013-01-07 DIAGNOSIS — O26879 Cervical shortening, unspecified trimester: Secondary | ICD-10-CM

## 2013-01-07 DIAGNOSIS — O10019 Pre-existing essential hypertension complicating pregnancy, unspecified trimester: Secondary | ICD-10-CM | POA: Insufficient documentation

## 2013-01-07 DIAGNOSIS — O30039 Twin pregnancy, monochorionic/diamniotic, unspecified trimester: Secondary | ICD-10-CM | POA: Insufficient documentation

## 2013-01-07 DIAGNOSIS — O24410 Gestational diabetes mellitus in pregnancy, diet controlled: Secondary | ICD-10-CM

## 2013-01-07 DIAGNOSIS — O30001 Twin pregnancy, unspecified number of placenta and unspecified number of amniotic sacs, first trimester: Secondary | ICD-10-CM

## 2013-01-07 DIAGNOSIS — O26872 Cervical shortening, second trimester: Secondary | ICD-10-CM

## 2013-01-07 HISTORY — DX: Other specified abnormal immunological findings in serum: R76.8

## 2013-01-07 HISTORY — DX: Gestational (pregnancy-induced) hypertension without significant proteinuria, unspecified trimester: O13.9

## 2013-01-07 LAB — URINE MICROSCOPIC-ADD ON

## 2013-01-07 LAB — URINALYSIS, ROUTINE W REFLEX MICROSCOPIC
Bilirubin Urine: NEGATIVE
Glucose, UA: NEGATIVE mg/dL
Ketones, ur: 15 mg/dL — AB
Nitrite: NEGATIVE
pH: 6 (ref 5.0–8.0)

## 2013-01-07 NOTE — MAU Note (Signed)
Lower abdominal pain with movement & lower abdominal pressure x 1 week. Denies vaginal bleeding or leaking of fluid. Clear mucous discharge since yesterday. Positive fetal movement. Been on bedrest since end of September.

## 2013-01-07 NOTE — MAU Provider Note (Signed)
History     CSN: [id number]  Arrival date and time: 01/07/13 2038   None     Chief Complaint  Patient presents with  . Abdominal Pain   HPI  Pt is a 27y.o [id number] who presents at 31w0 mono-di preg with lower abdominal pain and pressure for the past week. Pain worse with movement and at the end of the day. Positional changes occasionally relieve discomfort. Not associated with food intake. Had soft brown BM this am. Attesting to some dysuria and hesistancy with urination but no hematuria. Denies vag bleeding/fluid leakage. Has had clear white mucus discharge since yesterday, constant in underwear. Had a similar thing when cervix first became thinned out. Still feeling good fetal movement.  PNC at [facility]. Had nml genetic screen, nml anatomic [place]. Abn glucose screen, diet controlled (A1DM). Also with cHTN, on aldomet 500mg  BID 08/28/12. Baseline 24hr urine 84mg /day. Pt also with notable shorten cervix in 2nd trimester (initially noted to be 2.2cm with funneling and was started on prometrium 200mg  PV qhs, recheck in 3 weeks noted to be 4mm and procardia 30mg Xl was added)  Has been on bedrest since the end of September. Denies Headache,n,v,RUQ,epigastric pain or vision changes.  OB History   Grav Para Term Preterm Abortions TAB SAB Ect Mult Living   5 1  1 3  3   1       Past Medical History  Diagnosis Date  . Preeclampsia   . Hx of trichomoniasis   . Pregnancy induced hypertension   . Gestational diabetes     diet controlled with G1  . HSV-2 seropositive     Past Surgical History  Procedure Laterality Date  . Breast surgery      Family History  Problem Relation Age of Onset  . Hypertension Mother   . Cancer Maternal Grandmother     History  Substance Use Topics  . Smoking status: Former Smoker -- 2 years    Types: Cigarettes  . Smokeless tobacco: Never Used  . Alcohol Use: No     Comment: occ.; not now    Allergies: No Known Allergies  Prescriptions prior to admission   Medication Sig Dispense Refill  . methyldopa (ALDOMET) 500 MG tablet Take 1 tablet (500 mg total) by mouth 2 (two) times daily.  120 tablet  3  . NIFEdipine (PROCARDIA-XL/ADALAT-CC/NIFEDICAL-XL) 30 MG 24 hr tablet Take 1 tablet (30 mg total) by mouth daily.  30 tablet  3  . prenatal vitamin w/FE, FA (PRENATAL 1 + 1) 27-1 MG TABS Take 1 tablet by mouth daily at 12 noon.  30 each  11  . progesterone (PROMETRIUM) 200 MG capsule Take 1 capsule (200 mg total) by mouth daily. Place one capsule in vagina every night  30 capsule  4    Review of Systems  Constitutional: Negative for fever and chills.  HENT: Negative for congestion and sore throat.   Eyes: Negative for blurred vision, double vision, photophobia and pain.  Respiratory: Negative for cough, shortness of breath and wheezing.   Cardiovascular: Negative for chest pain and palpitations.  Gastrointestinal: Positive for abdominal pain. Negative for heartburn, nausea, vomiting, diarrhea, constipation, blood in stool and melena.  Genitourinary: Positive for dysuria. Negative for urgency, frequency and hematuria.  Musculoskeletal: Positive for myalgias. Negative for falls and neck pain.  Skin: Negative for rash.  Neurological: Negative for dizziness, seizures, loss of consciousness, weakness and headaches.  Endo/Heme/Allergies: Does not bruise/bleed easily.  Psychiatric/Behavioral: Negative for depression.   Physical Exam  Blood pressure 132/84, pulse 116, temperature 98.7 F (37.1 C), temperature source Oral, resp. rate 20, height 5' 4.25" (1.632 m), weight 89.359 kg (197 lb), last menstrual period 06/10/2012.  Physical Exam  Constitutional: She is oriented to person, place, and time. She appears well-developed and well-nourished.  HENT:  Head: Normocephalic and atraumatic.  Eyes: EOM are normal.  Neck: Neck supple. No thyromegaly present.  Cardiovascular: Normal rate.   Respiratory: Effort normal. No respiratory distress.  GI: Soft.  Bowel sounds are normal. Distention: gravid. There is tenderness (suprapubic with deep palpation). There is no rebound and no guarding.  Musculoskeletal: Normal range of motion. She exhibits edema (1+ in bilateral feet).  Neurological: She is alert and oriented to person, place, and time.  Skin: Skin is warm and dry. No erythema.  Psychiatric: She has a normal mood and affect. Her behavior is normal.    MAU Course  Procedures  MDM U/A- concentrated, elevated spec grav, dirty catch [place] for cervical length  Assessment and Plan  Ms Shadd is a 27 y.o [phone] who presents at 31w0 with abdominal pain and pressure  1. Ab pain/pressure- most likely 2/2 round ligament pain. No contraction pattern noticed, FHR reassuring. Transvag US for cervical length actually improved from prev (0.4cm -> 1.1 cm). UTI less likely given UA but pt also needing to be rehydrated -encourage increased PO intake -given labor precautions -stretching and strengthening activities for round ligament  2. Short cervical length- improved since pt has been on prometrium and procardia -continue above meds -f/up in FT clinic   3. cHTN- BPs 130s/80s here, no signs/sx of pre-e -continue aldomet -monitor for sequelae of pre-e  4. gDM-A1- continue with diet modification  5. FHR- reassuring -cat I  Anselm Lis 01/07/2013, 9:35 PM   I have seen and examined this patient and I agree with the above. Clelia Croft, Torsha Lemus 11:25 PM 01/07/2013

## 2013-01-15 ENCOUNTER — Other Ambulatory Visit: Payer: Self-pay | Admitting: Advanced Practice Midwife

## 2013-01-15 ENCOUNTER — Other Ambulatory Visit: Payer: Self-pay | Admitting: Obstetrics & Gynecology

## 2013-01-15 ENCOUNTER — Ambulatory Visit (INDEPENDENT_AMBULATORY_CARE_PROVIDER_SITE_OTHER): Payer: Self-pay | Admitting: Obstetrics & Gynecology

## 2013-01-15 ENCOUNTER — Encounter: Payer: Self-pay | Admitting: Obstetrics & Gynecology

## 2013-01-15 ENCOUNTER — Ambulatory Visit (INDEPENDENT_AMBULATORY_CARE_PROVIDER_SITE_OTHER): Payer: Managed Care, Other (non HMO)

## 2013-01-15 VITALS — BP 118/70 | Wt 197.0 lb

## 2013-01-15 DIAGNOSIS — O99810 Abnormal glucose complicating pregnancy: Secondary | ICD-10-CM

## 2013-01-15 DIAGNOSIS — O10013 Pre-existing essential hypertension complicating pregnancy, third trimester: Secondary | ICD-10-CM

## 2013-01-15 DIAGNOSIS — O26873 Cervical shortening, third trimester: Secondary | ICD-10-CM

## 2013-01-15 DIAGNOSIS — O30009 Twin pregnancy, unspecified number of placenta and unspecified number of amniotic sacs, unspecified trimester: Secondary | ICD-10-CM

## 2013-01-15 DIAGNOSIS — Z331 Pregnant state, incidental: Secondary | ICD-10-CM

## 2013-01-15 DIAGNOSIS — O10019 Pre-existing essential hypertension complicating pregnancy, unspecified trimester: Secondary | ICD-10-CM

## 2013-01-15 DIAGNOSIS — O09219 Supervision of pregnancy with history of pre-term labor, unspecified trimester: Secondary | ICD-10-CM

## 2013-01-15 DIAGNOSIS — O0993 Supervision of high risk pregnancy, unspecified, third trimester: Secondary | ICD-10-CM

## 2013-01-15 DIAGNOSIS — O30001 Twin pregnancy, unspecified number of placenta and unspecified number of amniotic sacs, first trimester: Secondary | ICD-10-CM

## 2013-01-15 DIAGNOSIS — O26879 Cervical shortening, unspecified trimester: Secondary | ICD-10-CM

## 2013-01-15 DIAGNOSIS — O98519 Other viral diseases complicating pregnancy, unspecified trimester: Secondary | ICD-10-CM

## 2013-01-15 DIAGNOSIS — O99019 Anemia complicating pregnancy, unspecified trimester: Secondary | ICD-10-CM

## 2013-01-15 DIAGNOSIS — O09899 Supervision of other high risk pregnancies, unspecified trimester: Secondary | ICD-10-CM

## 2013-01-15 DIAGNOSIS — O26872 Cervical shortening, second trimester: Secondary | ICD-10-CM

## 2013-01-15 DIAGNOSIS — Z1389 Encounter for screening for other disorder: Secondary | ICD-10-CM

## 2013-01-15 LAB — POCT URINALYSIS DIPSTICK
Blood, UA: NEGATIVE
Glucose, UA: NEGATIVE
Ketones, UA: NEGATIVE

## 2013-01-15 NOTE — Progress Notes (Signed)
U/S(32+1wks)- twin IUP, membrane noted, CX-1.4cm with small amount of funneling present(measured vaginally) all anterior placenta Gr 2, EFW discordance = 9% Baby A- Breech, active fetus BPP 8/8, Single deepest pocket of fluid noted = 2.1cm, EFW 4 lb 1 oz (42nd%tile), UA Doppler RI-0.53 & 0.52, female fetus, maternal Lt side Baby B- Vtx, active fetus, BPP 8/8, Single deepest pocket of fluid noted=3.8cm, EFW 3 lb 12 oz(30th%tile), UA Doppler RI-0.63 & 0.59, female fetus, maternal Rt side

## 2013-01-15 NOTE — Progress Notes (Signed)
Sonogram is reviewed, cervix actually a bit better on prometrium and procardia, growth excelletn with 9% discordance, continue with twice weekly testing, NST and sonograms  BP weight and urine results all reviewed and noted. Patient reports good fetal movement, denies any bleeding and no rupture of membranes symptoms or regular contractions. Patient is without complaints. All questions were answered.

## 2013-01-19 ENCOUNTER — Other Ambulatory Visit: Payer: Self-pay | Admitting: Obstetrics & Gynecology

## 2013-01-19 ENCOUNTER — Ambulatory Visit (INDEPENDENT_AMBULATORY_CARE_PROVIDER_SITE_OTHER): Payer: Managed Care, Other (non HMO) | Admitting: Obstetrics & Gynecology

## 2013-01-19 ENCOUNTER — Encounter (INDEPENDENT_AMBULATORY_CARE_PROVIDER_SITE_OTHER): Payer: Self-pay

## 2013-01-19 ENCOUNTER — Encounter: Payer: Self-pay | Admitting: Obstetrics & Gynecology

## 2013-01-19 VITALS — BP 120/80 | Wt 195.0 lb

## 2013-01-19 DIAGNOSIS — Z1389 Encounter for screening for other disorder: Secondary | ICD-10-CM

## 2013-01-19 DIAGNOSIS — O10019 Pre-existing essential hypertension complicating pregnancy, unspecified trimester: Secondary | ICD-10-CM

## 2013-01-19 DIAGNOSIS — O30009 Twin pregnancy, unspecified number of placenta and unspecified number of amniotic sacs, unspecified trimester: Secondary | ICD-10-CM

## 2013-01-19 DIAGNOSIS — O09213 Supervision of pregnancy with history of pre-term labor, third trimester: Secondary | ICD-10-CM

## 2013-01-19 DIAGNOSIS — O10013 Pre-existing essential hypertension complicating pregnancy, third trimester: Secondary | ICD-10-CM

## 2013-01-19 DIAGNOSIS — O24919 Unspecified diabetes mellitus in pregnancy, unspecified trimester: Secondary | ICD-10-CM

## 2013-01-19 DIAGNOSIS — O26873 Cervical shortening, third trimester: Secondary | ICD-10-CM

## 2013-01-19 DIAGNOSIS — O24913 Unspecified diabetes mellitus in pregnancy, third trimester: Secondary | ICD-10-CM

## 2013-01-19 DIAGNOSIS — O99810 Abnormal glucose complicating pregnancy: Secondary | ICD-10-CM

## 2013-01-19 DIAGNOSIS — O09219 Supervision of pregnancy with history of pre-term labor, unspecified trimester: Secondary | ICD-10-CM

## 2013-01-19 LAB — POCT URINALYSIS DIPSTICK
Glucose, UA: NEGATIVE
Ketones, UA: NEGATIVE

## 2013-01-19 NOTE — Addendum Note (Signed)
Addended by: Richardson Chiquito on: 01/19/2013 11:40 AM   Modules accepted: Orders

## 2013-01-19 NOTE — Progress Notes (Signed)
Reactive NST x 2   Good movement x 2 "never stops" BP weight and urine results all reviewed and noted. Patient reports good fetal movement, denies any bleeding and no rupture of membranes symptoms or regular contractions. Patient is without complaints. All questions were answered. Continue Aldomet, Procardia, prometrium Pt is on proper diet but has yet to have her appt with dietitian

## 2013-01-22 ENCOUNTER — Ambulatory Visit (INDEPENDENT_AMBULATORY_CARE_PROVIDER_SITE_OTHER): Payer: Managed Care, Other (non HMO)

## 2013-01-22 ENCOUNTER — Encounter: Payer: Self-pay | Admitting: Obstetrics & Gynecology

## 2013-01-22 ENCOUNTER — Ambulatory Visit (INDEPENDENT_AMBULATORY_CARE_PROVIDER_SITE_OTHER): Payer: Managed Care, Other (non HMO) | Admitting: Obstetrics & Gynecology

## 2013-01-22 VITALS — BP 108/70 | Wt 195.8 lb

## 2013-01-22 DIAGNOSIS — R81 Glycosuria: Secondary | ICD-10-CM

## 2013-01-22 DIAGNOSIS — Z23 Encounter for immunization: Secondary | ICD-10-CM

## 2013-01-22 DIAGNOSIS — O10013 Pre-existing essential hypertension complicating pregnancy, third trimester: Secondary | ICD-10-CM

## 2013-01-22 DIAGNOSIS — Z1389 Encounter for screening for other disorder: Secondary | ICD-10-CM

## 2013-01-22 DIAGNOSIS — O26873 Cervical shortening, third trimester: Secondary | ICD-10-CM

## 2013-01-22 DIAGNOSIS — O30009 Twin pregnancy, unspecified number of placenta and unspecified number of amniotic sacs, unspecified trimester: Secondary | ICD-10-CM

## 2013-01-22 DIAGNOSIS — O26879 Cervical shortening, unspecified trimester: Secondary | ICD-10-CM

## 2013-01-22 DIAGNOSIS — O24410 Gestational diabetes mellitus in pregnancy, diet controlled: Secondary | ICD-10-CM

## 2013-01-22 DIAGNOSIS — O10019 Pre-existing essential hypertension complicating pregnancy, unspecified trimester: Secondary | ICD-10-CM

## 2013-01-22 DIAGNOSIS — O99810 Abnormal glucose complicating pregnancy: Secondary | ICD-10-CM

## 2013-01-22 DIAGNOSIS — O30001 Twin pregnancy, unspecified number of placenta and unspecified number of amniotic sacs, first trimester: Secondary | ICD-10-CM

## 2013-01-22 DIAGNOSIS — Z331 Pregnant state, incidental: Secondary | ICD-10-CM

## 2013-01-22 DIAGNOSIS — O26872 Cervical shortening, second trimester: Secondary | ICD-10-CM

## 2013-01-22 LAB — POCT URINALYSIS DIPSTICK
Ketones, UA: NEGATIVE
Leukocytes, UA: NEGATIVE
Nitrite, UA: NEGATIVE

## 2013-01-22 LAB — GLUCOSE, POCT (MANUAL RESULT ENTRY): POC Glucose: 201 mg/dl — AB (ref 70–99)

## 2013-01-22 MED ORDER — INFLUENZA VAC SPLIT QUAD 0.5 ML IM SUSP
0.5000 mL | Freq: Once | INTRAMUSCULAR | Status: AC
  Administered 2013-01-22: 0.5 mL via INTRAMUSCULAR

## 2013-01-22 NOTE — Progress Notes (Signed)
sonogrm with excellent fetal reports for both twins.  Cervix quite short, s/p BMZ, on prometrium and procardia Still has not been contacted regarding her GDM counselling.  I have counselled pt and she is sticking to a carb restricted diet but no BS monitoring is being done BS today 201, had donut holes for lunch along with some lemonade, she knows that was 2 dietary fumbles BP weight and urine results all reviewed and noted. Patient reports good fetal movement, denies any bleeding and no rupture of membranes symptoms or regular contractions. Patient is without complaints. All questions were answered.

## 2013-01-22 NOTE — Progress Notes (Signed)
U/S(33+1wks)-twin IUP, All Anterior Placenta GR 2, membrane noted, CX-108mm with funneling noted (funnel=71mm in width and 16mm in length), Baby A-vtx active fetus BPP 8/8, fluid wnl single deepest pocket-2.2cm, UA Doppler RI-0.53, FHR-163 bpm Baby B-vtx active fetus, BPP 8/8, fluid wnl single deepest pocket-3.4cm, UA Doppler RI-0.58, FHR-143 bpm

## 2013-01-26 ENCOUNTER — Encounter: Payer: Self-pay | Admitting: Obstetrics & Gynecology

## 2013-01-26 ENCOUNTER — Ambulatory Visit (INDEPENDENT_AMBULATORY_CARE_PROVIDER_SITE_OTHER): Payer: Managed Care, Other (non HMO) | Admitting: Obstetrics & Gynecology

## 2013-01-26 VITALS — BP 120/80 | Wt 196.0 lb

## 2013-01-26 DIAGNOSIS — O30009 Twin pregnancy, unspecified number of placenta and unspecified number of amniotic sacs, unspecified trimester: Secondary | ICD-10-CM

## 2013-01-26 DIAGNOSIS — O10019 Pre-existing essential hypertension complicating pregnancy, unspecified trimester: Secondary | ICD-10-CM

## 2013-01-26 DIAGNOSIS — O99810 Abnormal glucose complicating pregnancy: Secondary | ICD-10-CM

## 2013-01-26 DIAGNOSIS — Z331 Pregnant state, incidental: Secondary | ICD-10-CM

## 2013-01-26 DIAGNOSIS — Z1389 Encounter for screening for other disorder: Secondary | ICD-10-CM

## 2013-01-26 DIAGNOSIS — O10013 Pre-existing essential hypertension complicating pregnancy, third trimester: Secondary | ICD-10-CM

## 2013-01-26 LAB — POCT URINALYSIS DIPSTICK
Glucose, UA: NEGATIVE
Ketones, UA: NEGATIVE
Leukocytes, UA: NEGATIVE
Nitrite, UA: NEGATIVE
Protein, UA: NEGATIVE

## 2013-01-26 MED ORDER — B & B CARPAL TUNNEL BRACE MISC: 1.0000 | Freq: Every day | Status: DC

## 2013-01-26 NOTE — Addendum Note (Signed)
Addended by: Colen Darling on: 01/26/2013 03:21 PM   Modules accepted: Orders

## 2013-01-26 NOTE — Addendum Note (Signed)
Addended by: Lazaro Arms on: 01/26/2013 03:19 PM   Modules accepted: Orders

## 2013-01-26 NOTE — Progress Notes (Signed)
Reactive NST BP weight and urine results all reviewed and noted. Patient reports good fetal movement x 2, denies any bleeding and no rupture of membranes symptoms or regular contractions. Patient is without complaints. All questions were answered.

## 2013-01-29 ENCOUNTER — Ambulatory Visit (INDEPENDENT_AMBULATORY_CARE_PROVIDER_SITE_OTHER): Payer: Managed Care, Other (non HMO)

## 2013-01-29 ENCOUNTER — Ambulatory Visit (INDEPENDENT_AMBULATORY_CARE_PROVIDER_SITE_OTHER): Payer: Managed Care, Other (non HMO) | Admitting: Obstetrics & Gynecology

## 2013-01-29 ENCOUNTER — Encounter: Payer: Self-pay | Admitting: Obstetrics & Gynecology

## 2013-01-29 ENCOUNTER — Other Ambulatory Visit: Payer: Self-pay | Admitting: Obstetrics & Gynecology

## 2013-01-29 VITALS — BP 120/80 | Wt 199.0 lb

## 2013-01-29 DIAGNOSIS — O09219 Supervision of pregnancy with history of pre-term labor, unspecified trimester: Secondary | ICD-10-CM

## 2013-01-29 DIAGNOSIS — O30009 Twin pregnancy, unspecified number of placenta and unspecified number of amniotic sacs, unspecified trimester: Secondary | ICD-10-CM

## 2013-01-29 DIAGNOSIS — O10013 Pre-existing essential hypertension complicating pregnancy, third trimester: Secondary | ICD-10-CM

## 2013-01-29 DIAGNOSIS — O26879 Cervical shortening, unspecified trimester: Secondary | ICD-10-CM

## 2013-01-29 DIAGNOSIS — O99019 Anemia complicating pregnancy, unspecified trimester: Secondary | ICD-10-CM

## 2013-01-29 DIAGNOSIS — O24913 Unspecified diabetes mellitus in pregnancy, third trimester: Secondary | ICD-10-CM

## 2013-01-29 DIAGNOSIS — O26873 Cervical shortening, third trimester: Secondary | ICD-10-CM

## 2013-01-29 DIAGNOSIS — O99810 Abnormal glucose complicating pregnancy: Secondary | ICD-10-CM

## 2013-01-29 DIAGNOSIS — O09213 Supervision of pregnancy with history of pre-term labor, third trimester: Secondary | ICD-10-CM

## 2013-01-29 DIAGNOSIS — O10019 Pre-existing essential hypertension complicating pregnancy, unspecified trimester: Secondary | ICD-10-CM

## 2013-01-29 MED ORDER — ACYCLOVIR 400 MG PO TABS: 400.0000 mg | ORAL_TABLET | Freq: Three times a day (TID) | ORAL | Status: DC

## 2013-01-29 NOTE — Progress Notes (Signed)
Sonogram noted, report done.  Normal sonogram except woefully thin cervical length BP weight and urine results all reviewed and noted. Patient reports good fetal movement, denies any bleeding and no rupture of membranes symptoms or regular contractions. Patient is without complaints. All questions were answered.

## 2013-01-29 NOTE — Progress Notes (Signed)
U/S(34+1wks)- twin IUP, membrane noted, all anterior Gr 2 placenta, CX-2+mm(measured vaginally) EFW discordance-9% Baby A- vtx active fetus, BPP 8/8, single deepest pocket of fluid=2.6cm, approp growth EFW 5 lb 7 oz (52nd%tile), UA Doppler RI-0.54 & 0.50, maternal LT side Baby B-vtx active fetus BPP 8/8, single deepest pocket of fluid-4.6cm, approp growth EFW 4 lb 15 oz (36th%tile), UA Doppler RI-0.60 & 0.55, maternal RT side

## 2013-02-02 ENCOUNTER — Encounter: Payer: Self-pay | Admitting: Obstetrics & Gynecology

## 2013-02-02 ENCOUNTER — Ambulatory Visit (INDEPENDENT_AMBULATORY_CARE_PROVIDER_SITE_OTHER): Payer: Managed Care, Other (non HMO) | Admitting: Obstetrics & Gynecology

## 2013-02-02 VITALS — BP 128/80 | Wt 204.0 lb

## 2013-02-02 DIAGNOSIS — O30009 Twin pregnancy, unspecified number of placenta and unspecified number of amniotic sacs, unspecified trimester: Secondary | ICD-10-CM

## 2013-02-02 DIAGNOSIS — O99810 Abnormal glucose complicating pregnancy: Secondary | ICD-10-CM

## 2013-02-02 DIAGNOSIS — O0993 Supervision of high risk pregnancy, unspecified, third trimester: Secondary | ICD-10-CM

## 2013-02-02 DIAGNOSIS — Z1389 Encounter for screening for other disorder: Secondary | ICD-10-CM

## 2013-02-02 DIAGNOSIS — O10013 Pre-existing essential hypertension complicating pregnancy, third trimester: Secondary | ICD-10-CM

## 2013-02-02 DIAGNOSIS — Z331 Pregnant state, incidental: Secondary | ICD-10-CM

## 2013-02-02 DIAGNOSIS — O10019 Pre-existing essential hypertension complicating pregnancy, unspecified trimester: Secondary | ICD-10-CM

## 2013-02-02 LAB — POCT URINALYSIS DIPSTICK
Ketones, UA: NEGATIVE
Protein, UA: NEGATIVE

## 2013-02-02 NOTE — Progress Notes (Signed)
Reactive NST x 2 no contractions BP weight and urine results all reviewed and noted. Patient reports good fetal movement x 2, denies any bleeding and no rupture of membranes symptoms or regular contractions. Patient is without complaints. All questions were answered.

## 2013-02-04 ENCOUNTER — Encounter: Payer: Managed Care, Other (non HMO) | Attending: Obstetrics & Gynecology

## 2013-02-04 ENCOUNTER — Telehealth: Payer: Self-pay | Admitting: *Deleted

## 2013-02-04 VITALS — Ht 66.0 in | Wt 205.0 lb

## 2013-02-04 DIAGNOSIS — Z713 Dietary counseling and surveillance: Secondary | ICD-10-CM | POA: Insufficient documentation

## 2013-02-04 DIAGNOSIS — O99810 Abnormal glucose complicating pregnancy: Secondary | ICD-10-CM | POA: Insufficient documentation

## 2013-02-04 DIAGNOSIS — O24410 Gestational diabetes mellitus in pregnancy, diet controlled: Secondary | ICD-10-CM

## 2013-02-05 ENCOUNTER — Encounter: Payer: Managed Care, Other (non HMO) | Admitting: Obstetrics and Gynecology

## 2013-02-05 ENCOUNTER — Other Ambulatory Visit: Payer: Self-pay | Admitting: Obstetrics and Gynecology

## 2013-02-05 ENCOUNTER — Ambulatory Visit (INDEPENDENT_AMBULATORY_CARE_PROVIDER_SITE_OTHER): Payer: Managed Care, Other (non HMO)

## 2013-02-05 ENCOUNTER — Inpatient Hospital Stay (HOSPITAL_COMMUNITY)
Admission: AD | Admit: 2013-02-05 | Discharge: 2013-02-06 | DRG: 781 | Disposition: A | Discharge status: Home / Self Care | Payer: Managed Care, Other (non HMO) | Source: Ambulatory Visit | Attending: Obstetrics & Gynecology | Admitting: Obstetrics & Gynecology

## 2013-02-05 ENCOUNTER — Encounter (HOSPITAL_COMMUNITY): Payer: Self-pay | Admitting: *Deleted

## 2013-02-05 ENCOUNTER — Other Ambulatory Visit: Payer: Self-pay | Admitting: Obstetrics & Gynecology

## 2013-02-05 DIAGNOSIS — O26879 Cervical shortening, unspecified trimester: Secondary | ICD-10-CM | POA: Diagnosis present

## 2013-02-05 DIAGNOSIS — O30009 Twin pregnancy, unspecified number of placenta and unspecified number of amniotic sacs, unspecified trimester: Secondary | ICD-10-CM

## 2013-02-05 DIAGNOSIS — O26873 Cervical shortening, third trimester: Secondary | ICD-10-CM

## 2013-02-05 DIAGNOSIS — O10019 Pre-existing essential hypertension complicating pregnancy, unspecified trimester: Secondary | ICD-10-CM | POA: Diagnosis present

## 2013-02-05 DIAGNOSIS — O368190 Decreased fetal movements, unspecified trimester, not applicable or unspecified: Secondary | ICD-10-CM

## 2013-02-05 DIAGNOSIS — O99810 Abnormal glucose complicating pregnancy: Secondary | ICD-10-CM | POA: Diagnosis present

## 2013-02-05 DIAGNOSIS — O30039 Twin pregnancy, monochorionic/diamniotic, unspecified trimester: Secondary | ICD-10-CM

## 2013-02-05 DIAGNOSIS — O10013 Pre-existing essential hypertension complicating pregnancy, third trimester: Secondary | ICD-10-CM

## 2013-02-05 DIAGNOSIS — O4700 False labor before 37 completed weeks of gestation, unspecified trimester: Secondary | ICD-10-CM

## 2013-02-05 DIAGNOSIS — O30001 Twin pregnancy, unspecified number of placenta and unspecified number of amniotic sacs, first trimester: Secondary | ICD-10-CM

## 2013-02-05 DIAGNOSIS — O26872 Cervical shortening, second trimester: Secondary | ICD-10-CM

## 2013-02-05 DIAGNOSIS — R768 Other specified abnormal immunological findings in serum: Secondary | ICD-10-CM

## 2013-02-05 DIAGNOSIS — Z87891 Personal history of nicotine dependence: Secondary | ICD-10-CM

## 2013-02-05 DIAGNOSIS — O24410 Gestational diabetes mellitus in pregnancy, diet controlled: Secondary | ICD-10-CM

## 2013-02-05 LAB — URINALYSIS, ROUTINE W REFLEX MICROSCOPIC
Bilirubin Urine: NEGATIVE
Glucose, UA: NEGATIVE mg/dL
Hgb urine dipstick: NEGATIVE
Ketones, ur: 15 mg/dL — AB
Protein, ur: NEGATIVE mg/dL
Urobilinogen, UA: 0.2 mg/dL (ref 0.0–1.0)

## 2013-02-05 LAB — CBC
HCT: 32.6 % — ABNORMAL LOW (ref 36.0–46.0)
Hemoglobin: 11.2 g/dL — ABNORMAL LOW (ref 12.0–15.0)
MCH: 30.4 pg (ref 26.0–34.0)
MCHC: 34.4 g/dL (ref 30.0–36.0)
MCV: 88.6 fL (ref 78.0–100.0)
Platelets: 186 10*3/uL (ref 150–400)
RBC: 3.68 MIL/uL — ABNORMAL LOW (ref 3.87–5.11)
RDW: 14.2 % (ref 11.5–15.5)
WBC: 8.6 10*3/uL (ref 4.0–10.5)

## 2013-02-05 LAB — TYPE AND SCREEN
ABO/RH(D): A POS
Antibody Screen: NEGATIVE

## 2013-02-05 LAB — URINE MICROSCOPIC-ADD ON

## 2013-02-05 LAB — OB RESULTS CONSOLE GBS: GBS: POSITIVE

## 2013-02-05 MED ORDER — EPHEDRINE 5 MG/ML INJ: 10.0000 mg | INTRAVENOUS | Status: DC | PRN

## 2013-02-05 MED ORDER — ONDANSETRON HCL 4 MG/2ML IJ SOLN: 4.0000 mg | Freq: Four times a day (QID) | INTRAMUSCULAR | Status: DC | PRN

## 2013-02-05 MED ORDER — LACTATED RINGERS IV SOLN: 500.0000 mL | INTRAVENOUS | Status: DC | PRN

## 2013-02-05 MED ORDER — IBUPROFEN 600 MG PO TABS: 600.0000 mg | ORAL_TABLET | Freq: Four times a day (QID) | ORAL | Status: DC | PRN

## 2013-02-05 MED ORDER — PHENYLEPHRINE 40 MCG/ML (10ML) SYRINGE FOR IV PUSH (FOR BLOOD PRESSURE SUPPORT): 80.0000 ug | PREFILLED_SYRINGE | INTRAVENOUS | Status: DC | PRN

## 2013-02-05 MED ORDER — OXYCODONE-ACETAMINOPHEN 5-325 MG PO TABS: 1.0000 | ORAL_TABLET | ORAL | Status: DC | PRN

## 2013-02-05 MED ORDER — LIDOCAINE HCL (PF) 1 % IJ SOLN: 30.0000 mL | INTRAMUSCULAR | Status: DC | PRN

## 2013-02-05 MED ORDER — DIPHENHYDRAMINE HCL 50 MG/ML IJ SOLN: 12.5000 mg | INTRAMUSCULAR | Status: DC | PRN

## 2013-02-05 MED ORDER — CITRIC ACID-SODIUM CITRATE 334-500 MG/5ML PO SOLN: 30.0000 mL | ORAL | Status: DC | PRN

## 2013-02-05 MED ORDER — OXYTOCIN BOLUS FROM INFUSION: 500.0000 mL | INTRAVENOUS | Status: DC

## 2013-02-05 MED ORDER — FLEET ENEMA 7-19 GM/118ML RE ENEM: 1.0000 | ENEMA | RECTAL | Status: DC | PRN

## 2013-02-05 MED ORDER — OXYTOCIN 40 UNITS IN LACTATED RINGERS INFUSION - SIMPLE MED: 62.5000 mL/h | INTRAVENOUS | Status: DC

## 2013-02-05 MED ORDER — METHYLDOPA 500 MG PO TABS
500.0000 mg | ORAL_TABLET | Freq: Two times a day (BID) | ORAL | Status: DC
  Administered 2013-02-05 – 2013-02-06 (×2): 500 mg via ORAL
  Filled 2013-02-05 (×4): qty 1

## 2013-02-05 MED ORDER — PROGESTERONE MICRONIZED 200 MG PO CAPS
200.0000 mg | ORAL_CAPSULE | Freq: Every day | ORAL | Status: DC
  Administered 2013-02-05: 200 mg via VAGINAL
  Filled 2013-02-05 (×2): qty 1

## 2013-02-05 MED ORDER — SODIUM CHLORIDE 0.9 % IV SOLN: INTRAVENOUS | Status: DC

## 2013-02-05 MED ORDER — LACTATED RINGERS IV SOLN: 500.0000 mL | Freq: Once | INTRAVENOUS | Status: DC

## 2013-02-05 MED ORDER — NIFEDIPINE ER 30 MG PO TB24
30.0000 mg | ORAL_TABLET | Freq: Every day | ORAL | Status: DC
  Administered 2013-02-06: 30 mg via ORAL
  Filled 2013-02-05 (×2): qty 1

## 2013-02-05 MED ORDER — PRENATAL MULTIVITAMIN CH
1.0000 | ORAL_TABLET | Freq: Every day | ORAL | Status: DC
  Administered 2013-02-06: 1 via ORAL
  Filled 2013-02-05 (×2): qty 1

## 2013-02-05 MED ORDER — ZOLPIDEM TARTRATE 5 MG PO TABS
5.0000 mg | ORAL_TABLET | Freq: Every evening | ORAL | Status: DC | PRN
  Administered 2013-02-05: 5 mg via ORAL
  Filled 2013-02-05: qty 1

## 2013-02-05 MED ORDER — ACETAMINOPHEN 325 MG PO TABS: 650.0000 mg | ORAL_TABLET | ORAL | Status: DC | PRN

## 2013-02-05 MED ORDER — LACTATED RINGERS IV SOLN: INTRAVENOUS | Status: DC

## 2013-02-05 MED ORDER — ACYCLOVIR 400 MG PO TABS
400.0000 mg | ORAL_TABLET | Freq: Three times a day (TID) | ORAL | Status: DC
  Administered 2013-02-05 – 2013-02-06 (×2): 400 mg via ORAL
  Filled 2013-02-05 (×5): qty 1

## 2013-02-05 MED ORDER — FENTANYL 2.5 MCG/ML BUPIVACAINE 1/10 % EPIDURAL INFUSION (WH - ANES): 14.0000 mL/h | INTRAMUSCULAR | Status: DC | PRN

## 2013-02-05 NOTE — Progress Notes (Signed)
U/S(35+1wks)-twin IUP, all anterior Gr 2 placenta, membrane noted, EFW discordance 9% Baby A- vtx, active fetus, EFW 2570 gms (5 lb 11 oz 43rd%tile), BPP 8/8, fluid WNL single deepest pocket of fluid=4.6cm, UA Doppler RI-0.62 & 0.47 Baby B-vtx non active fetus, EFW 2340 gms (5 lb 3 oz 26th%tile), BPP 2/8 (absent tone, resp movt and limb movt) fluid WNL single deepest pocket of fluid=4.4cm, UA Doppler RI-0.65 Dr. Emelda Fear come into exam room to observe

## 2013-02-05 NOTE — Progress Notes (Signed)
   Julia [name] is a 27 y.o. [id number] at [redacted]w[redacted]d  admitted for fetal surveillance after Baby B had a 2/8 BPP  Subjective: No c/o.  Feels mild contractions  Objective: BP 131/84  Pulse 92  Temp(Src) 98.1 F (36.7 C) (Oral)  Resp 18  Ht 5\' 6"  (1.676 m)  Wt 92.987 kg (205 lb)  BMI 33.10 kg/m2  LMP 06/10/2012    FHT: BABY A; FHR: 130 bpm, variability: moderate,  accelerations:  Present,  decelerations:  Absent BABY B:  FHR 140, avg variability, + accels, no decels UC:   regular, every 4 minutes  Labs: Lab Results  Component Value Date   WBC 8.6 02/05/2013   HGB 11.2* 02/05/2013   HCT 32.6* 02/05/2013   MCV 88.6 02/05/2013   PLT 186 02/05/2013    Assessment / Plan: Reactive NST X 2  Continue EFM for a while, repeat BPP in am  Julia [name] 02/05/2013, 7:12 PM

## 2013-02-05 NOTE — H&P (Signed)
Julia [name] is a 27 y.o. female ,admitted due to BPP 2/8 (normal fluid volume) on baby B of twin gestation, at 35w 1day, vertex/vertex , for NST baby B (on right) and consideration of delivery.  Pregnancy is notable for diamniotic, monochorionic twins gestation, symmetric growth(9% discordance) to date, vertex/vertex. Pregnancy also notable for CHTN on aldomet 500 bid, and Gest DMA-1, diet controlled, and short cervix, treated with prometrium  per vagina. Cervix today is 3 cm/70%/-2/vertex. Testing of baby B today also notes S/D ratio of 2.84, RI 0.65, with good end diastolic flow,EFW. 5+3 Testing of baby A today shows  A  S/D ratio 1.89,  RI 0.62 , 0.47, with good end diastolic flow. EFW 5+11  Patient has been followed with twice weekly testing since September with NST/BPP alternating, and antenatal testing normal to date.  Case discussed with Dr Harlon Flor, patient sent for admission, NST on arrival, if NST reactive promptly, keep on continuous monitoring overnite and repeat BPP in a.m . Will deliver promptly for any abnormal fetal monitoring strip. History OB History   Grav Para Term Preterm Abortions TAB SAB Ect Mult Living   5 1  1 3  3   1      Past Medical History  Diagnosis Date  . Preeclampsia   . Hx of trichomoniasis   . Pregnancy induced hypertension   . Gestational diabetes     diet controlled with G1  . HSV-2 seropositive    Past Surgical History  Procedure Laterality Date  . Breast surgery     Family History: family history includes Cancer in her maternal grandmother; Hypertension in her mother. Social History:  reports that she has quit smoking. Her smoking use included Cigarettes. She smoked 0.00 packs per day for 2 years. She has never used smokeless tobacco. She reports that she does not drink alcohol or use illicit drugs.   Prenatal Transfer Tool  Maternal Diabetes: Yes:  Diabetes Type:  Diet controlled Genetic Screening: Normal Maternal Ultrasounds/Referrals:  Normal 9% growth discordance,EFW A5+11, EFW B 5+3 Fetal Ultrasounds or other Referrals:  None Maternal Substance Abuse:  No Significant Maternal Medications:  Meds include: Other:  aldomet 500 bid Significant Maternal Lab Results:  Lab values include: Other:  GBS to be collected on admit Other Comments:  None  ROS    Last menstrual period 06/10/2012. Exam Physical Exam  Constitutional: She appears well-developed and well-nourished.  HENT:  Head: Normocephalic and atraumatic.  Eyes: Pupils are equal, round, and reactive to light.  Cardiovascular: Normal rate.   Respiratory: Effort normal.  GI:  Gravid uterus c/w twin gestation and gest age  Genitourinary: Vagina normal.  Cx : 3/70/-2/vtx    LMP 06/10/2012   Prenatal labs: ABO, Rh:  a pos  Antibody: NEG (09/25 0917) Rubella: 1.93 (06/12 1455) RPR: NON REAC (09/25 0917)  HBsAg: NEGATIVE (06/12 1455)  HIV: NON REACTIVE (09/25 0917)  GBS:     Assessment/Plan: Biophysical profile 2/8 , baby B twins, vertex.vertex [redacted]w[redacted]d,  Admit L&d for monitoring, NST on arrival, continuous monitoring, if reactive NST , repeat BPP in am, deliver for  monitoring abnormalities.   Teddie Curd V 02/05/2013, 5:19 PM

## 2013-02-05 NOTE — Progress Notes (Signed)
  Patient was seen on 02/04/13 for Gestational Diabetes self-management class at the [facility]. The following learning objectives were met by the patient during this course:   States the definition of Gestational Diabetes  States why dietary management is important in controlling blood glucose  Describes the effects of carbohydrates on blood glucose levels  Demonstrates ability to create a balanced meal plan  Demonstrates carbohydrate counting   States when to check blood glucose levels  Demonstrates proper blood glucose monitoring techniques  States the effect of stress and exercise on blood glucose levels  States the importance of limiting caffeine and abstaining from alcohol and smoking  Plan:  Aim for 2 Carb Choices per meal (30 grams) +/- 1 either way for breakfast Aim for 3 Carb Choices per meal (45 grams) +/- 1 either way from lunch and dinner Aim for 1-2 Carbs per snack Begin reading food labels for Total Carbohydrate and sugar grams of foods Consider  increasing your activity level by walking daily as tolerated Begin checking BG before breakfast and 1-2 hours after first bit of breakfast, lunch and dinner after  as directed by MD  Take medication  as directed by MD  Blood glucose monitor given:  One Touch Ultra Mini Self Monitoring Kit Lot # [id number] X Exp: [id number] Blood glucose reading: 103  Patient instructed to monitor glucose levels: FBS: 60 - <90 1 hour: <140 2 hour: <120  Patient received the following handouts:  Nutrition Diabetes and Pregnancy  Carbohydrate Counting List  Meal Planning worksheet  Patient will be seen for follow-up as needed.

## 2013-02-05 NOTE — H&P (Signed)
Julia [name] is a 27 y.o. female ,admitted due to BPP 2/8 (normal fluid volume) on baby B of twin gestation, at 35w 1day, vertex/vertex , for NST baby B (on right) and consideration of delivery.   Pregnancy is notable for diamniotic, monochorionic twins gestation, symmetric growth(9% discordance) to date, vertex/vertex. Pregnancy also notable for CHTN on aldomet 500 bid, and Gest DMA-1, diet controlled, and short cervix, treated with prometrium  per vagina. Cervix today is 3 cm/70%/-2/vertex. Testing of baby B today also notes S/D ratio of 2.84, RI 0.65, with good end diastolic flow,EFW. 5+3 Testing of baby A today shows  A  S/D ratio 1.89,  RI 0.62 , 0.47, with good end diastolic flow. EFW 5+11   Patient has been followed with twice weekly testing since September with NST/BPP alternating, and antenatal testing normal to date.   Case discussed with Dr Harlon Flor, patient sent for admission, NST on arrival, if NST reactive promptly, keep on continuous monitoring overnite and repeat BPP in a.m . Will deliver promptly for any abnormal fetal monitoring strip. History OB History     Grav  Para  Term  Preterm  Abortions  TAB  SAB  Ect  Mult  Living     5  1    1  3    3      1        Past Medical History   Diagnosis  Date   .  Preeclampsia     .  Hx of trichomoniasis     .  Pregnancy induced hypertension     .  Gestational diabetes         diet controlled with G1   .  HSV-2 seropositive      Past Surgical History   Procedure  Laterality  Date   .  Breast surgery        Family History: family history includes Cancer in her maternal grandmother; Hypertension in her mother. Social History: reports that she has quit smoking. Her smoking use included Cigarettes. She smoked 0.00 packs per day for 2 years. She has never used smokeless tobacco. She reports that she does not drink alcohol or use illicit drugs.      Prenatal Transfer Tool    Maternal Diabetes: Yes:  Diabetes Type:  Diet  controlled Genetic Screening: Normal Maternal Ultrasounds/Referrals: Normal 9% growth discordance,EFW A5+11, EFW B 5+3 Fetal Ultrasounds or other Referrals:  None Maternal Substance Abuse:  No Significant Maternal Medications:  Meds include: Other: aldomet 500 bid Significant Maternal Lab Results:  Lab values include: Other: GBS to be collected on admit Other Comments:  None   ROS   Last menstrual period 06/10/2012. Exam Physical Exam  Constitutional: She appears well-developed and well-nourished.  HENT:   Head: Normocephalic and atraumatic.  Eyes: Pupils are equal, round, and reactive to light.  Cardiovascular: Normal rate.   Respiratory: Effort normal.  GI:  Gravid uterus c/w twin gestation and gest age  Genitourinary: Vagina normal.  Cx : 3/70/-2/vtx    LMP 06/10/2012     Prenatal labs: ABO, Rh: a pos   Antibody: NEG (09/25 0917) Rubella: 1.93 (06/12 1455) RPR: NON REAC (09/25 0917)   HBsAg: NEGATIVE (06/12 1455)   HIV: NON REACTIVE (09/25 0917)   GBS:    Assessment/Plan: Biophysical profile 2/8 , baby B twins, vertex.vertex [redacted]w[redacted]d,   Admit L&d for monitoring, NST on arrival, continuous monitoring, if reactive NST , repeat BPP in am, deliver for  monitoring abnormalities.  H&P for current admission, copied from related encounter 11/20. See also progress note 11/20 by F. Cresenzo-Dishmon. Cosigned to original Chartered loss adjuster, Dr. Emelda Fear.

## 2013-02-06 ENCOUNTER — Inpatient Hospital Stay (HOSPITAL_COMMUNITY): Payer: Managed Care, Other (non HMO)

## 2013-02-06 ENCOUNTER — Other Ambulatory Visit: Payer: Self-pay | Admitting: Obstetrics and Gynecology

## 2013-02-06 DIAGNOSIS — O99810 Abnormal glucose complicating pregnancy: Secondary | ICD-10-CM

## 2013-02-06 LAB — GLUCOSE, CAPILLARY: Glucose-Capillary: 96 mg/dL (ref 70–99)

## 2013-02-06 LAB — RPR: RPR Ser Ql: NONREACTIVE

## 2013-02-06 NOTE — Consult Note (Signed)
Maternal Fetal Medicine Consultation  Requesting Provider(s): Dr. Erin Fulling  Reason for consultation: Monochorionic twins - BPP 4/8 last night  HPI: Julia [name] is a 27 yo [id number] currently at 61 3/7 weeks with MC/DA twins - was seen in clinic late afternoon yesterday and noted to have a BPP of 4/8.  The patient was transferred to [facility] - monitored throughout the night with reactive NSTs x 2 and reassuring testing throughout the night.  Julia [name] pregnancy has also been complicated by shortened cervix, chronic hypertension and A1 GDM - she is currently on vaginal Prometrium.She is without complaints today - reports that both fetuses are active.  OB History: OB History   Grav Para Term Preterm Abortions TAB SAB Ect Mult Living   5 1  1 3  3   1     Preterm SVD at 35 weeks- complicated by preeclampsia Early SABs x 3  PMH:  Past Medical History  Diagnosis Date  . Preeclampsia   . Hx of trichomoniasis   . Pregnancy induced hypertension   . Gestational diabetes     diet controlled with G1  . HSV-2 seropositive     PSH:  Past Surgical History  Procedure Laterality Date  . Breast surgery     Meds:  Scheduled Meds: . acyclovir  400 mg Oral TID  . lactated ringers  500 mL Intravenous Once  . methyldopa  500 mg Oral BID  . NIFEdipine  30 mg Oral Daily  . prenatal multivitamin  1 tablet Oral Q1200  . progesterone  200 mg Vaginal QHS   Continuous Infusions: . sodium chloride    . fentaNYL 2.5 mcg/ml w/bupivacaine 1/10% in NS epidural infusion ( total)    . lactated ringers    . oxytocin 40 units in LR 1000 mL    . oxytocin 40 units in LR 1000 mL     PRN Meds:.acetaminophen, citric acid-sodium citrate, diphenhydrAMINE, ePHEDrine, ePHEDrine, fentaNYL 2.5 mcg/ml w/bupivacaine 1/10% in NS epidural infusion ( total), ibuprofen, lactated ringers, lidocaine (PF), ondansetron, oxyCODONE-acetaminophen, phenylephrine, phenylephrine, sodium  phosphate, zolpidem  Allergies:No Known Allergies  FH: denies family history of birth defects or hereditary disorders  Soc: denies tobacco, ETOH use or illicit drug use  Review of Systems: no vaginal bleeding or cramping/contractions, no LOF, no nausea/vomiting. All other systems reviewed and are negative.  PE:  VS: 132/86, 141/85, 87,20  GEN: well-appearing female ABD: gravid, NT  Ultrasound: MC/DA twin gestation with best dates of 34 3/7 weeks  Twin A  Maternal right, cephalic presentation, anterior placenta Active fetus with BPP of 8/8 Normal amniotic fluid volume (MVP 5.5 cm)  Twin B Maternal left, cephalic presentation, anterior placenta Active fetus - BPP 8/8 Normal amniotic fluid volume (MVP 6.8 cm)  Labs: CBC    Component Value Date/Time   WBC 8.6 02/05/2013 1825   RBC 3.68* 02/05/2013 1825   HGB 11.2* 02/05/2013 1825   HCT 32.6* 02/05/2013 1825   PLT 186 02/05/2013 1825   MCV 88.6 02/05/2013 1825   MCH 30.4 02/05/2013 1825   MCHC 34.4 02/05/2013 1825   RDW 14.2 02/05/2013 1825   LYMPHSABS 1.6 08/26/2008 2228   MONOABS 1.1* 08/26/2008 2228   EOSABS 0.2 08/26/2008 2228   BASOSABS 0.1 08/26/2008 2228     A/P: 1) MC/DA twins at 34 3/7 weeks - patient has been monitored for nearly 24 hrs - NSTs reassuring/ reactive x 2.  Both BPPs are 8/8.  Feel that the patient can be  discharged home - recommend continued 2x weekly NSTs with weekly AFIs or weekly BPPs following discharge.  Would recommend delivery by 37 weeks due to monochorionic placentation. Recommendations discussed with Dr. Erin Fulling         2) CHTN - on Aldomet / Procardia - continue to follow BPs at least weekly         3) Shortened cervix on vaginal Prometrium         4) A1 GDM - diet controlled         5) Hx of HSV on Valtrex   Thank you for the opportunity to be a part of the care of Julia [name]. Please contact our office if we can be of further assistance.   I spent approximately 15  minutes with this patient with over 50% of time spent in face-to-face counseling.  Alpha Gula, MD Maternal Fetal Medicine

## 2013-02-06 NOTE — Discharge Summary (Signed)
Attestation of Attending Supervision of Fellow: Evaluation and management procedures were performed by the Fellow under my supervision and collaboration.  I have reviewed the Fellow's note and chart, and I agree with the management and plan.    

## 2013-02-06 NOTE — Discharge Summary (Signed)
Antenatal Physician Discharge Summary  Patient ID: Julia [name] MRN: [id number] DOB/AGE: December 07, 1985 27 y.o.  Admit date: 02/05/2013 Discharge date: 02/06/2013  Admission Diagnoses: Mono-Di twins with concordant growth, nonreassuring fetal status of baby B with BPP of 2/8, cHTN, A1DM, shortened cervix  Discharge Diagnoses: Mono-di twins with concordant growth, BPP of 8/8 on both baby A and B,cHTN, A1DM, shortened cervix  Prenatal Procedures: NST and ultrasound  Intrapartum Procedures:  Maternal Fetal Medicine   Significant Diagnostic Studies:  Results for orders placed during the hospital encounter of 02/05/13 (from the past 168 hour(s))  GLUCOSE, CAPILLARY   Collection Time    02/05/13  6:18 PM      Result Value Range   Glucose-Capillary 75  70 - 99 mg/dL  CBC   Collection Time    02/05/13  6:25 PM      Result Value Range   WBC 8.6  4.0 - 10.5 K/uL   RBC 3.68 (*) 3.87 - 5.11 MIL/uL   Hemoglobin 11.2 (*) 12.0 - 15.0 g/dL   HCT 82.9 (*) 56.2 - 13.0 %   MCV 88.6  78.0 - 100.0 fL   MCH 30.4  26.0 - 34.0 pg   MCHC 34.4  30.0 - 36.0 g/dL   RDW 86.5  78.4 - 69.6 %   Platelets 186  150 - 400 K/uL  RPR   Collection Time    02/05/13  6:25 PM      Result Value Range   RPR NON REACTIVE  NON REACTIVE  TYPE AND SCREEN   Collection Time    02/05/13  6:25 PM      Result Value Range   ABO/RH(D) A POS     Antibody Screen NEG     Sample Expiration 02/08/2013    ABO/RH   Collection Time    02/05/13  6:25 PM      Result Value Range   ABO/RH(D) A POS    URINALYSIS, ROUTINE W REFLEX MICROSCOPIC   Collection Time    02/05/13  8:05 PM      Result Value Range   Color, Urine YELLOW  YELLOW   APPearance CLEAR  CLEAR   Specific Gravity, Urine 1.025  1.005 - 1.030   pH 6.0  5.0 - 8.0   Glucose, UA NEGATIVE  NEGATIVE mg/dL   Hgb urine dipstick NEGATIVE  NEGATIVE   Bilirubin Urine NEGATIVE  NEGATIVE   Ketones, ur 15 (*) NEGATIVE mg/dL   Protein, ur NEGATIVE  NEGATIVE mg/dL   Urobilinogen, UA 0.2  0.0 - 1.0 mg/dL   Nitrite NEGATIVE  NEGATIVE   Leukocytes, UA SMALL (*) NEGATIVE  URINE MICROSCOPIC-ADD ON   Collection Time    02/05/13  8:05 PM      Result Value Range   Squamous Epithelial / LPF RARE  RARE   WBC, UA 3-6  <3 WBC/hpf   RBC / HPF 0-2  <3 RBC/hpf   Bacteria, UA RARE  RARE  GLUCOSE, CAPILLARY   Collection Time    02/06/13  8:00 AM      Result Value Range   Glucose-Capillary 85  70 - 99 mg/dL  GLUCOSE, CAPILLARY   Collection Time    02/06/13 10:44 AM      Result Value Range   Glucose-Capillary 96  70 - 99 mg/dL  Results for orders placed in visit on 02/02/13 (from the past 168 hour(s))  POCT URINALYSIS DIPSTICK   Collection Time    02/02/13  4:17 PM  Result Value Range   Color, UA       Clarity, UA       Glucose, UA neg     Bilirubin, UA       Ketones, UA neg     Spec Grav, UA       Blood, UA neg     pH, UA       Protein, UA neg     Urobilinogen, UA       Nitrite, UA neg     Leukocytes, UA moderate (2+)      Treatments: prolonged monitoring   Hospital Course:  This is a 27 y.o. [id number] with mono-di tiwns with concordant growth at [redacted]w[redacted]d admitted for monitoring for nonreassuring BPP of 2/8 on baby B in clinic on 11/20. Pt was admitted and placed on the monitor. She was initially having a few small contractions and ffound to be 3/70/-2. Both fetuses were traced overnight and beautifully reactive with no decelerations. In the AM, a repeat BPP at MFM showed both baby A and baby B to have BPPs of 8/8.  As such, she was discharged to home in stable condition with PTL precautions.  She will f/u next [facility] for [facility]   No changes were made to her cHTN medications and her blood sugars were well controlled.  She was left on the prometrium for shortened cervix until 36 weeks.      Discharge Exam: BP 141/85  Pulse 87  Temp(Src) 97.6 F (36.4 C) (Oral)  Resp 20  Ht 5\' 6"  (1.676 m)  Wt 92.987 kg (205 lb)  BMI 33.10  kg/m2  LMP 06/10/2012 General appearance: alert, cooperative and no distress Resp: clear to auscultation bilaterally Cardio: regular rate and rhythm GI: soft, non-tender; bowel sounds normal; no masses,  gravid Pulses: 2+ and symmetric Cervix: deferred as no contractions and preterm status.   FHT: baby A- 130, mod var, +accels, no decels         Baby B- 125, mod var, +accels, no decels   Discharge Condition: good  Disposition: 01-Home or Self Care  Discharge Orders   Future Orders Complete By Expires   Discharge patient  As directed    Comments:     To home       Medication List         acyclovir 400 MG tablet  Commonly known as:  ZOVIRAX  Take 1 tablet (400 mg total) by mouth 3 (three) times daily.     B & B CARPAL TUNNEL BRACE Misc  1 Device by Does not apply route daily.     methyldopa 500 MG tablet  Commonly known as:  ALDOMET  Take 1 tablet (500 mg total) by mouth 2 (two) times daily.     NIFEdipine 30 MG 24 hr tablet  Commonly known as:  PROCARDIA-XL/ADALAT-CC/NIFEDICAL-XL  Take 1 tablet (30 mg total) by mouth daily.     prenatal multivitamin Tabs tablet  Take 1 tablet by mouth daily at 12 noon.     progesterone 200 MG capsule  Commonly known as:  PROMETRIUM  Take 1 capsule (200 mg total) by mouth daily. Place one capsule in vagina every night           Follow-up Information   Follow up with [facility]. Schedule an appointment as soon as possible for a visit in 3 days.   Specialty:  Obstetrics and Gynecology   Contact information:   [address]  [address] [place] [address] [phone]      Signed: Vale Haven M.D. 02/06/2013, 2:22 PM

## 2013-02-06 NOTE — Progress Notes (Signed)
Discharge instructions given, pt verbalizes and understands, pt discharged home 

## 2013-02-06 NOTE — Progress Notes (Signed)
Pt returned from MFM, DR Tana Conch of BPP results, will come see pt

## 2013-02-06 NOTE — Progress Notes (Signed)
Continuous EFM all night:  Baselines 120's-130's, avg LTV, + frequent accels and no decels.  Has occ mild contractions.  Scheduled for a repeat BPP this am with MFM.

## 2013-02-06 NOTE — H&P (Signed)
Attestation of Attending Supervision of Advanced Practitioner: Evaluation and management procedures were performed by the PA/NP/CNM/OB Fellow under my supervision/collaboration. Chart reviewed and agree with management and plan.  Tilda Burrow 02/06/2013 10:05 AM   Attestation of Attending Supervision of Advanced Practitioner: Evaluation and management procedures were performed by the PA/NP/CNM/OB Fellow under my supervision/collaboration. Chart reviewed and agree with management and plan.  Kayleah Appleyard V 02/06/2013 10:05 AM

## 2013-02-09 ENCOUNTER — Ambulatory Visit (INDEPENDENT_AMBULATORY_CARE_PROVIDER_SITE_OTHER): Payer: Managed Care, Other (non HMO) | Admitting: Obstetrics and Gynecology

## 2013-02-09 ENCOUNTER — Encounter: Payer: Self-pay | Admitting: Obstetrics and Gynecology

## 2013-02-09 VITALS — BP 140/98 | Wt 203.0 lb

## 2013-02-09 DIAGNOSIS — Z331 Pregnant state, incidental: Secondary | ICD-10-CM

## 2013-02-09 DIAGNOSIS — O9989 Other specified diseases and conditions complicating pregnancy, childbirth and the puerperium: Secondary | ICD-10-CM

## 2013-02-09 DIAGNOSIS — O26879 Cervical shortening, unspecified trimester: Secondary | ICD-10-CM

## 2013-02-09 DIAGNOSIS — O30009 Twin pregnancy, unspecified number of placenta and unspecified number of amniotic sacs, unspecified trimester: Secondary | ICD-10-CM

## 2013-02-09 DIAGNOSIS — O10019 Pre-existing essential hypertension complicating pregnancy, unspecified trimester: Secondary | ICD-10-CM

## 2013-02-09 DIAGNOSIS — O0990 Supervision of high risk pregnancy, unspecified, unspecified trimester: Secondary | ICD-10-CM | POA: Insufficient documentation

## 2013-02-09 DIAGNOSIS — O09219 Supervision of pregnancy with history of pre-term labor, unspecified trimester: Secondary | ICD-10-CM

## 2013-02-09 DIAGNOSIS — Z1389 Encounter for screening for other disorder: Secondary | ICD-10-CM

## 2013-02-09 DIAGNOSIS — O99810 Abnormal glucose complicating pregnancy: Secondary | ICD-10-CM

## 2013-02-09 DIAGNOSIS — O0993 Supervision of high risk pregnancy, unspecified, third trimester: Secondary | ICD-10-CM

## 2013-02-09 DIAGNOSIS — O09899 Supervision of other high risk pregnancies, unspecified trimester: Secondary | ICD-10-CM

## 2013-02-09 LAB — POCT URINALYSIS DIPSTICK
Blood, UA: NEGATIVE
Glucose, UA: NEGATIVE
Ketones, UA: NEGATIVE
Nitrite, UA: NEGATIVE

## 2013-02-09 NOTE — Progress Notes (Signed)
Both babies active.  NST reactive x 2 . Pt now 35w 5d. As per note, by mfm, will plan to delivery by 37 wk.  IOL scheduled  For morning at 7 AM on 30 November, 36 w 4 days. Pitocin IOL.

## 2013-02-09 NOTE — Patient Instructions (Signed)
Biophysical profile BPP at [facility] hospital Friday at 8-9am Induction scheduled for 7 am on Sunday November 30

## 2013-02-10 ENCOUNTER — Encounter (HOSPITAL_COMMUNITY): Payer: Self-pay | Admitting: *Deleted

## 2013-02-10 ENCOUNTER — Telehealth (HOSPITAL_COMMUNITY): Payer: Self-pay | Admitting: *Deleted

## 2013-02-10 LAB — CULTURE, BETA STREP (GROUP B ONLY)

## 2013-02-10 NOTE — Telephone Encounter (Signed)
Preadmission screen  

## 2013-02-13 ENCOUNTER — Inpatient Hospital Stay (HOSPITAL_COMMUNITY)
Admission: AD | Admit: 2013-02-13 | Discharge: 2013-02-13 | Disposition: A | Discharge status: Home / Self Care | Payer: Managed Care, Other (non HMO) | Source: Ambulatory Visit | Attending: Obstetrics and Gynecology | Admitting: Obstetrics and Gynecology

## 2013-02-13 ENCOUNTER — Inpatient Hospital Stay (HOSPITAL_COMMUNITY): Payer: Managed Care, Other (non HMO)

## 2013-02-13 ENCOUNTER — Encounter (HOSPITAL_COMMUNITY): Payer: Self-pay | Admitting: General Practice

## 2013-02-13 DIAGNOSIS — O99810 Abnormal glucose complicating pregnancy: Secondary | ICD-10-CM | POA: Insufficient documentation

## 2013-02-13 DIAGNOSIS — O30009 Twin pregnancy, unspecified number of placenta and unspecified number of amniotic sacs, unspecified trimester: Secondary | ICD-10-CM | POA: Insufficient documentation

## 2013-02-13 DIAGNOSIS — O10019 Pre-existing essential hypertension complicating pregnancy, unspecified trimester: Secondary | ICD-10-CM | POA: Insufficient documentation

## 2013-02-13 DIAGNOSIS — O26879 Cervical shortening, unspecified trimester: Secondary | ICD-10-CM | POA: Insufficient documentation

## 2013-02-13 DIAGNOSIS — O30039 Twin pregnancy, monochorionic/diamniotic, unspecified trimester: Secondary | ICD-10-CM | POA: Insufficient documentation

## 2013-02-13 LAB — URINALYSIS, ROUTINE W REFLEX MICROSCOPIC
Bilirubin Urine: NEGATIVE
Glucose, UA: NEGATIVE mg/dL
Hgb urine dipstick: NEGATIVE
Ketones, ur: NEGATIVE mg/dL
Leukocytes, UA: NEGATIVE
Protein, ur: NEGATIVE mg/dL
Specific Gravity, Urine: >1.03 — ABNORMAL HIGH (ref 1.005–1.030)
pH: 5.5 (ref 5.0–8.0)

## 2013-02-13 NOTE — MAU Provider Note (Signed)
History    CSN: [id number] Arrival date and time: 02/13/13 7829  Chief Complaint  Patient presents with  . Twins, BPP    HPI  Julia [name] is a 27 y.o. [id number] at [redacted]w[redacted]d gestation with twins here for routine biweekly BPP with NST.   She has no complaints at this time. Denies gush of fluid, VB, regular contractions. Has continued to feel good fetal movement. Denies HA, vision changes, RUQ pain, increased swelling.   Her pregnancy is significant for monochorionic, diamniotic twins which have shown symmetrical growth, and have been vertex/vertex. She also has chronic HTN on aldomet 500 BID, A1GDM, and short cervix on prometrium.    OB History   Grav Para Term Preterm Abortions TAB SAB Ect Mult Living   5 1  1 3  3   1       Past Medical History  Diagnosis Date  . Preeclampsia   . Hx of trichomoniasis   . Pregnancy induced hypertension   . Gestational diabetes     diet controlled with G1  . HSV-2 seropositive     Past Surgical History  Procedure Laterality Date  . Breast surgery      Family History  Problem Relation Age of Onset  . Hypertension Mother   . Cancer Maternal Grandmother   . Heart attack Maternal Grandfather   . Asthma Son   . Arthritis Neg Hx   . Alcohol abuse Neg Hx   . Birth defects Neg Hx   . COPD Neg Hx   . Depression Neg Hx   . Diabetes Neg Hx   . Drug abuse Neg Hx   . Early death Neg Hx   . Hearing loss Neg Hx   . Heart disease Neg Hx   . Hyperlipidemia Neg Hx   . Kidney disease Neg Hx   . Learning disabilities Neg Hx   . Mental illness Neg Hx   . Mental retardation Neg Hx   . Miscarriages / Stillbirths Neg Hx   . Stroke Neg Hx   . Vision loss Neg Hx   . Varicose Veins Neg Hx     History  Substance Use Topics  . Smoking status: Former Smoker -- 2 years    Types: Cigarettes  . Smokeless tobacco: Never Used  . Alcohol Use: No     Comment: occ.; not now    Allergies: No Known Allergies  Prescriptions prior to admission   Medication Sig Dispense Refill  . acyclovir (ZOVIRAX) 400 MG tablet Take 1 tablet (400 mg total) by mouth 3 (three) times daily.  90 tablet  3  . methyldopa (ALDOMET) 500 MG tablet Take 1 tablet (500 mg total) by mouth 2 (two) times daily.  120 tablet  3  . NIFEdipine (PROCARDIA-XL/ADALAT-CC/NIFEDICAL-XL) 30 MG 24 hr tablet Take 1 tablet (30 mg total) by mouth daily.  30 tablet  3  . Prenatal Vit-Fe Fumarate-FA (PRENATAL MULTIVITAMIN) TABS tablet Take 1 tablet by mouth daily at 12 noon.      . progesterone (PROMETRIUM) 200 MG capsule Take 1 capsule (200 mg total) by mouth daily. Place one capsule in vagina every night  30 capsule  4  . Elastic Bandages & Supports (B & B CARPAL TUNNEL BRACE) MISC 1 Device by Does not apply route daily.  1 each  0    ROS Physical Exam   Blood pressure 148/89, temperature 98.7 F (37.1 C), temperature source Oral, resp. rate 18, last menstrual period 06/10/2012.  Physical Exam  Constitutional: She is oriented to person, place, and time. No distress.  Eyes: Conjunctivae and EOM are normal.  Neck: Neck supple.  Cardiovascular: Normal rate, regular rhythm and intact distal pulses.   No murmur heard. Respiratory: Effort normal and breath sounds normal.  GI: Distention: soft, NT, gravid +FM.  Musculoskeletal: Edema: 1+ bilateral LE edema   Neurological: She is alert and oriented to person, place, and time. She has normal reflexes.  Skin: Skin is warm and dry.   FHT:  Baby A: 145 baseline, moderate variability, 2 accels seen, no decels Baby B: 150 baseline, moderate variability, 2 accels seen, no decels Toco: No regular contractions   02/13/2013 09:31  Color, Urine YELLOW  APPearance CLEAR  Specific Gravity, Urine >1.030 (H)  pH 5.5  Glucose NEGATIVE  Bilirubin Urine NEGATIVE  Ketones, ur NEGATIVE  Protein NEGATIVE  Urobilinogen, UA 0.2  Nitrite NEGATIVE  Leukocytes, UA NEGATIVE  Hgb urine dipstick NEGATIVE   MAU Course   Procedures  Assessment and Plan   Julia [name] is a 27 y.o. [id number] at [redacted]w[redacted]d, here for surveillance of twins with BPP 8/8.   Monochorionic/diamniotic twins: BPP 8/8, FHTs reassuring and reactive  Chronic HTN: U/A protein negative. - Continue aldomet 500 BID  A1GDM - Continue dietary management   HSV2 - Continue acyclovir  Short cervix - Discontinue prometrium before induction  GBS pos: - will need abx at induction  - Discharge home, induction 02/15/13 per Dr. Doristine Devoid, Ryan 02/13/2013, 10:46 AM

## 2013-02-13 NOTE — MAU Note (Signed)
Pt states here for BPP, twins, vertex/vertex, denies bleeding. Does note slight "wetness" in vaginal area. Denies gush of fluid.

## 2013-02-14 NOTE — MAU Provider Note (Signed)
Attestation of Attending Supervision of Advanced Practitioner: Evaluation and management procedures were performed by the PA/NP/CNM/OB Fellow under my supervision/collaboration. Chart reviewed and agree with management and plan.  Shaaron Golliday V 02/14/2013 10:07 PM

## 2013-02-15 ENCOUNTER — Inpatient Hospital Stay (HOSPITAL_COMMUNITY)
Admission: RE | Admit: 2013-02-15 | Discharge: 2013-02-17 | DRG: 774 | Disposition: A | Discharge status: Home / Self Care | Payer: Managed Care, Other (non HMO) | Source: Ambulatory Visit | Attending: Obstetrics and Gynecology | Admitting: Obstetrics and Gynecology

## 2013-02-15 ENCOUNTER — Inpatient Hospital Stay (HOSPITAL_COMMUNITY): Payer: Managed Care, Other (non HMO) | Admitting: Anesthesiology

## 2013-02-15 ENCOUNTER — Encounter (HOSPITAL_COMMUNITY): Payer: Self-pay

## 2013-02-15 ENCOUNTER — Encounter (HOSPITAL_COMMUNITY): Payer: Managed Care, Other (non HMO) | Admitting: Anesthesiology

## 2013-02-15 VITALS — BP 118/77 | HR 87 | Temp 98.3°F | Resp 18 | Ht 66.0 in | Wt 205.0 lb

## 2013-02-15 DIAGNOSIS — O30039 Twin pregnancy, monochorionic/diamniotic, unspecified trimester: Secondary | ICD-10-CM

## 2013-02-15 DIAGNOSIS — O30001 Twin pregnancy, unspecified number of placenta and unspecified number of amniotic sacs, first trimester: Secondary | ICD-10-CM

## 2013-02-15 DIAGNOSIS — O30009 Twin pregnancy, unspecified number of placenta and unspecified number of amniotic sacs, unspecified trimester: Secondary | ICD-10-CM

## 2013-02-15 DIAGNOSIS — Z22330 Carrier of Group B streptococcus: Secondary | ICD-10-CM

## 2013-02-15 DIAGNOSIS — O26872 Cervical shortening, second trimester: Secondary | ICD-10-CM

## 2013-02-15 DIAGNOSIS — O1002 Pre-existing essential hypertension complicating childbirth: Secondary | ICD-10-CM | POA: Diagnosis present

## 2013-02-15 DIAGNOSIS — O30003 Twin pregnancy, unspecified number of placenta and unspecified number of amniotic sacs, third trimester: Secondary | ICD-10-CM | POA: Diagnosis present

## 2013-02-15 DIAGNOSIS — O99892 Other specified diseases and conditions complicating childbirth: Secondary | ICD-10-CM | POA: Diagnosis present

## 2013-02-15 DIAGNOSIS — O328XX0 Maternal care for other malpresentation of fetus, not applicable or unspecified: Secondary | ICD-10-CM | POA: Diagnosis present

## 2013-02-15 DIAGNOSIS — O24410 Gestational diabetes mellitus in pregnancy, diet controlled: Secondary | ICD-10-CM

## 2013-02-15 DIAGNOSIS — O99814 Abnormal glucose complicating childbirth: Secondary | ICD-10-CM | POA: Diagnosis present

## 2013-02-15 DIAGNOSIS — R768 Other specified abnormal immunological findings in serum: Secondary | ICD-10-CM

## 2013-02-15 LAB — CBC
HCT: 33.3 % — ABNORMAL LOW (ref 36.0–46.0)
HCT: 30.6 % — ABNORMAL LOW (ref 36.0–46.0)
Hemoglobin: 11.4 g/dL — ABNORMAL LOW (ref 12.0–15.0)
MCH: 30.2 pg (ref 26.0–34.0)
MCHC: 34.2 g/dL (ref 30.0–36.0)
MCHC: 34 g/dL (ref 30.0–36.0)
MCV: 88.6 fL (ref 78.0–100.0)
MCV: 89 fL (ref 78.0–100.0)
Platelets: 163 10*3/uL (ref 150–400)
RBC: 3.44 MIL/uL — ABNORMAL LOW (ref 3.87–5.11)
WBC: 6.8 10*3/uL (ref 4.0–10.5)

## 2013-02-15 LAB — COMPREHENSIVE METABOLIC PANEL
AST: 20 U/L (ref 0–37)
BUN: 5 mg/dL — ABNORMAL LOW (ref 6–23)
CO2: 20 mEq/L (ref 19–32)
Calcium: 9.2 mg/dL (ref 8.4–10.5)
Chloride: 102 mEq/L (ref 96–112)
Creatinine, Ser: 0.55 mg/dL (ref 0.50–1.10)
GFR calc Af Amer: >90 mL/min (ref >90)
GFR calc non Af Amer: >90 mL/min (ref >90)
Glucose, Bld: 140 mg/dL — ABNORMAL HIGH (ref 70–99)
Total Bilirubin: 0.3 mg/dL (ref 0.3–1.2)
Total Protein: 5.4 g/dL — ABNORMAL LOW (ref 6.0–8.3)

## 2013-02-15 LAB — TYPE AND SCREEN

## 2013-02-15 LAB — GLUCOSE, CAPILLARY
Glucose-Capillary: 94 mg/dL (ref 70–99)
Glucose-Capillary: 131 mg/dL — ABNORMAL HIGH (ref 70–99)

## 2013-02-15 LAB — RPR: RPR Ser Ql: NONREACTIVE

## 2013-02-15 MED ORDER — OXYTOCIN BOLUS FROM INFUSION
500.0000 mL | INTRAVENOUS | Status: DC
  Administered 2013-02-15: 500 mL via INTRAVENOUS

## 2013-02-15 MED ORDER — PENICILLIN G POTASSIUM 5000000 UNITS IJ SOLR
5.0000 10*6.[IU] | Freq: Once | INTRAVENOUS | Status: AC
  Administered 2013-02-15: 5 10*6.[IU] via INTRAVENOUS
  Filled 2013-02-15: qty 5

## 2013-02-15 MED ORDER — LIDOCAINE HCL (PF) 1 % IJ SOLN
INTRAMUSCULAR | Status: DC | PRN
  Administered 2013-02-15 (×2): 9 mL

## 2013-02-15 MED ORDER — FENTANYL 2.5 MCG/ML BUPIVACAINE 1/10 % EPIDURAL INFUSION (WH - ANES)
14.0000 mL/h | INTRAMUSCULAR | Status: DC | PRN
  Filled 2013-02-15: qty 125

## 2013-02-15 MED ORDER — IBUPROFEN 600 MG PO TABS
600.0000 mg | ORAL_TABLET | Freq: Four times a day (QID) | ORAL | Status: DC | PRN
  Administered 2013-02-16: 600 mg via ORAL

## 2013-02-15 MED ORDER — ZOLPIDEM TARTRATE 5 MG PO TABS: 5.0000 mg | ORAL_TABLET | Freq: Every evening | ORAL | Status: DC | PRN

## 2013-02-15 MED ORDER — LACTATED RINGERS IV SOLN: 500.0000 mL | INTRAVENOUS | Status: DC | PRN

## 2013-02-15 MED ORDER — OXYCODONE-ACETAMINOPHEN 5-325 MG PO TABS: 1.0000 | ORAL_TABLET | ORAL | Status: DC | PRN

## 2013-02-15 MED ORDER — BENZOCAINE-MENTHOL 20-0.5 % EX AERO: 1.0000 "application " | INHALATION_SPRAY | CUTANEOUS | Status: DC | PRN

## 2013-02-15 MED ORDER — LACTATED RINGERS IV SOLN: INTRAVENOUS | Status: DC

## 2013-02-15 MED ORDER — OXYTOCIN 40 UNITS IN LACTATED RINGERS INFUSION - SIMPLE MED
1.0000 m[IU]/min | INTRAVENOUS | Status: DC
  Administered 2013-02-15: 2 m[IU]/min via INTRAVENOUS
  Filled 2013-02-15: qty 1000

## 2013-02-15 MED ORDER — FENTANYL 2.5 MCG/ML BUPIVACAINE 1/10 % EPIDURAL INFUSION (WH - ANES)
INTRAMUSCULAR | Status: DC | PRN
  Administered 2013-02-15: 14 mL/h via EPIDURAL

## 2013-02-15 MED ORDER — TERBUTALINE SULFATE 1 MG/ML IJ SOLN: 0.2500 mg | Freq: Once | INTRAMUSCULAR | Status: AC | PRN

## 2013-02-15 MED ORDER — CITRIC ACID-SODIUM CITRATE 334-500 MG/5ML PO SOLN: 30.0000 mL | ORAL | Status: DC | PRN

## 2013-02-15 MED ORDER — PHENYLEPHRINE 40 MCG/ML (10ML) SYRINGE FOR IV PUSH (FOR BLOOD PRESSURE SUPPORT)
80.0000 ug | PREFILLED_SYRINGE | INTRAVENOUS | Status: DC | PRN
  Filled 2013-02-15: qty 10
  Filled 2013-02-15: qty 2

## 2013-02-15 MED ORDER — ONDANSETRON HCL 4 MG/2ML IJ SOLN: 4.0000 mg | Freq: Four times a day (QID) | INTRAMUSCULAR | Status: DC | PRN

## 2013-02-15 MED ORDER — BUTORPHANOL TARTRATE 1 MG/ML IJ SOLN: 1.0000 mg | INTRAMUSCULAR | Status: DC | PRN

## 2013-02-15 MED ORDER — ONDANSETRON HCL 4 MG/2ML IJ SOLN: 4.0000 mg | INTRAMUSCULAR | Status: DC | PRN

## 2013-02-15 MED ORDER — PENICILLIN G POTASSIUM 5000000 UNITS IJ SOLR
2.5000 10*6.[IU] | INTRAVENOUS | Status: DC
  Administered 2013-02-15: 2.5 10*6.[IU] via INTRAVENOUS
  Filled 2013-02-15 (×5): qty 2.5

## 2013-02-15 MED ORDER — FLEET ENEMA 7-19 GM/118ML RE ENEM: 1.0000 | ENEMA | RECTAL | Status: DC | PRN

## 2013-02-15 MED ORDER — EPHEDRINE 5 MG/ML INJ
10.0000 mg | INTRAVENOUS | Status: DC | PRN
  Filled 2013-02-15: qty 2

## 2013-02-15 MED ORDER — LIDOCAINE HCL (PF) 1 % IJ SOLN
30.0000 mL | INTRAMUSCULAR | Status: DC | PRN
  Filled 2013-02-15: qty 30

## 2013-02-15 MED ORDER — LACTATED RINGERS IV SOLN
500.0000 mL | Freq: Once | INTRAVENOUS | Status: AC
  Administered 2013-02-15: 500 mL via INTRAVENOUS

## 2013-02-15 MED ORDER — ONDANSETRON HCL 4 MG PO TABS: 4.0000 mg | ORAL_TABLET | ORAL | Status: DC | PRN

## 2013-02-15 MED ORDER — ACETAMINOPHEN 325 MG PO TABS: 650.0000 mg | ORAL_TABLET | ORAL | Status: DC | PRN

## 2013-02-15 MED ORDER — DIPHENHYDRAMINE HCL 25 MG PO CAPS: 25.0000 mg | ORAL_CAPSULE | Freq: Four times a day (QID) | ORAL | Status: DC | PRN

## 2013-02-15 MED ORDER — TETANUS-DIPHTH-ACELL PERTUSSIS 5-2.5-18.5 LF-MCG/0.5 IM SUSP
0.5000 mL | Freq: Once | INTRAMUSCULAR | Status: AC
  Administered 2013-02-16: 0.5 mL via INTRAMUSCULAR
  Filled 2013-02-15: qty 0.5

## 2013-02-15 MED ORDER — WITCH HAZEL-GLYCERIN EX PADS: 1.0000 "application " | MEDICATED_PAD | CUTANEOUS | Status: DC | PRN

## 2013-02-15 MED ORDER — EPHEDRINE 5 MG/ML INJ
10.0000 mg | INTRAVENOUS | Status: DC | PRN
  Filled 2013-02-15: qty 4
  Filled 2013-02-15: qty 2

## 2013-02-15 MED ORDER — SENNOSIDES-DOCUSATE SODIUM 8.6-50 MG PO TABS
2.0000 | ORAL_TABLET | ORAL | Status: DC
  Administered 2013-02-16 – 2013-02-17 (×2): 2 via ORAL
  Filled 2013-02-15 (×2): qty 2

## 2013-02-15 MED ORDER — LANOLIN HYDROUS EX OINT: TOPICAL_OINTMENT | CUTANEOUS | Status: DC | PRN

## 2013-02-15 MED ORDER — PRENATAL MULTIVITAMIN CH
1.0000 | ORAL_TABLET | Freq: Every day | ORAL | Status: DC
  Administered 2013-02-16 – 2013-02-17 (×2): 1 via ORAL
  Filled 2013-02-15 (×2): qty 1

## 2013-02-15 MED ORDER — PHENYLEPHRINE 40 MCG/ML (10ML) SYRINGE FOR IV PUSH (FOR BLOOD PRESSURE SUPPORT)
80.0000 ug | PREFILLED_SYRINGE | INTRAVENOUS | Status: DC | PRN
  Filled 2013-02-15: qty 2

## 2013-02-15 MED ORDER — DIPHENHYDRAMINE HCL 50 MG/ML IJ SOLN: 12.5000 mg | INTRAMUSCULAR | Status: DC | PRN

## 2013-02-15 MED ORDER — DIBUCAINE 1 % RE OINT: 1.0000 "application " | TOPICAL_OINTMENT | RECTAL | Status: DC | PRN

## 2013-02-15 MED ORDER — AMLODIPINE BESYLATE 10 MG PO TABS
10.0000 mg | ORAL_TABLET | Freq: Every day | ORAL | Status: DC
  Administered 2013-02-15 – 2013-02-17 (×3): 10 mg via ORAL
  Filled 2013-02-15 (×4): qty 1

## 2013-02-15 MED ORDER — OXYTOCIN 40 UNITS IN LACTATED RINGERS INFUSION - SIMPLE MED
62.5000 mL/h | INTRAVENOUS | Status: DC
  Administered 2013-02-15: 62.5 mL/h via INTRAVENOUS

## 2013-02-15 MED ORDER — IBUPROFEN 600 MG PO TABS
600.0000 mg | ORAL_TABLET | Freq: Four times a day (QID) | ORAL | Status: DC
  Administered 2013-02-16 – 2013-02-17 (×6): 600 mg via ORAL
  Filled 2013-02-15 (×7): qty 1

## 2013-02-15 MED ORDER — SIMETHICONE 80 MG PO CHEW: 80.0000 mg | CHEWABLE_TABLET | ORAL | Status: DC | PRN

## 2013-02-15 NOTE — Progress Notes (Signed)
Patient stating that she "doesn't feel right".  BP high since transferring to [facility].  Patient taken to bathroom and felt SOB.  MD called, VS noted and patient understands when to call for RN.  MD to assess patient shortly.

## 2013-02-15 NOTE — Progress Notes (Signed)
Pt moving position in bed, having difficulty tracing fhr's

## 2013-02-15 NOTE — H&P (Signed)
Julia [name] is a 27 y.o. female presenting for Induction of labor at 36w 4 days for Twin gestation, Monochorionic, Diamniotic, with Induction planned to 36-37 wk as per MFM evaluation when pt had recent observation x 24 hours after BPP was 2/8 [name], repeated as 8/8 the next day.  Patient presents with contractions that are uncomfortable, Q 5 minutes, with cervical change since last week exam, now 5/50/-1 vertex/vertex , with cervix very soft. Pelvis adequate. Hx prior late pretern delivery  . History OB History   Grav Para Term Preterm Abortions TAB SAB Ect Mult Living   5 1  1 3  3   1      Past Medical History  Diagnosis Date  . Preeclampsia   . Hx of trichomoniasis   . Pregnancy induced hypertension   . Gestational diabetes     diet controlled with G1  . HSV-2 seropositive    Past Surgical History  Procedure Laterality Date  . Breast surgery     Family History: family history includes Asthma in her son; Cancer in her maternal grandmother; Heart attack in her maternal grandfather; Hypertension in her mother. There is no history of Arthritis, Alcohol abuse, Birth defects, COPD, Depression, Diabetes, Drug abuse, Early death, Hearing loss, Heart disease, Hyperlipidemia, Kidney disease, Learning disabilities, Mental illness, Mental retardation, Miscarriages / Stillbirths, Stroke, Vision loss, or Varicose Veins. Social History:  reports that she has quit smoking. Her smoking use included Cigarettes. She smoked 0.00 packs per day for 2 years. She has never used smokeless tobacco. She reports that she does not drink alcohol or use illicit drugs.   Prenatal Transfer Tool  Maternal Diabetes: Yes:  Diabetes Type:  Diet controlled with excellent control Genetic Screening: Normal Maternal Ultrasounds/Referrals: Normal Fetal Ultrasounds or other Referrals:  None Maternal Substance Abuse:  No Significant Maternal Medications:  Meds include: Other: Aldomet  500 bid Significant Maternal Lab  Results:  Lab values include: Group B Strep positive Other Comments:  None  ROS  Dilation: 5 Effacement (%): 50 Station: -1 Exam by:: Dr Emelda Fear Blood pressure 131/94, pulse 132, temperature 98 F (36.7 C), temperature source Oral, resp. rate 16, height 5\' 6"  (1.676 m), weight 92.987 kg (205 lb), last menstrual period 06/10/2012. Exam Physical Exam  Prenatal labs: ABO, Rh: --/--/A POS, A POS (11/20 1825) Antibody: NEG (11/20 1825) Rubella: 1.93 (06/12 1455) RPR: NON REACTIVE (11/20 1825)  HBsAg: NEGATIVE (06/12 1455)  HIV: NON REACTIVE (09/25 0917)  GBS:     Assessment/Plan: Pregnancy  36 w 4 days, Twin  Gestation VertexA/Vertex B, monochorionic diamniotic pregnancy,  Latent phase labor GBS positive Plan: will admit , AROM, expect progress toward svd x2.      Potential for complications including fetal distress Baby B requiring operative vaginal delivery, breech extraction , or cesarean delivery of baby B reviewed, with patient, who expressed understanding.   Julia [name] V 02/15/2013, 8:50 AM

## 2013-02-15 NOTE — Progress Notes (Signed)
Julia [name] is a 27 y.o. [id number] at [redacted]w[redacted]d by ultrasound admitted for induction of labor due to Monochorionic diamniotic twins, symmetric CHTN, GDM A1.,   Subjective: Pt s/p Epidural and AROM at last visit. Painful contractions but otherwise no complaints. Nurse having some difficulty monitoring both infants.   Objective: BP 150/95  Pulse 87  Temp(Src) 98.4 F (36.9 C) (Oral)  Resp 20  Ht 5\' 6"  (1.676 m)  Wt 92.987 kg (205 lb)  BMI 33.10 kg/m2  LMP 06/10/2012      FHT:  FHR: 150x2 bpm, variability: moderate,  accelerations:  Present,  decelerations:  Absent A: 150s mod var, mult accels, no recent decels, difficult to trace prior to epidrual and FSE B 130s mod var mult accels. No recent decels appreciated. Brief period during epidural with non continguous decel unable to distinguish from mothers. Improved now UC:   Appears regular but poor tracing, every 2-3 minutes SVE:   Dilation: 8 Effacement (%): 90 Station: 0 Exam by:: Julia [name] Clear fluid with placement of FSE likely forebag ruptured.  Labs: Lab Results  Component Value Date   WBC 6.8 02/15/2013   HGB 11.4* 02/15/2013   HCT 33.3* 02/15/2013   MCV 88.6 02/15/2013   PLT 178 02/15/2013    Assessment / Plan: Induction of labor due to twins, monochorionic diamniotic ,  progressing well on pitocin  Labor: Progressing normally on pitocin. Preeclampsia:  no sign, BPs in mild range. Fetal Wellbeing:  Category II 2/2 poor tracing. Overall reassuring Pain Control:  Epidural I/D:  GBS +, no s/p PCN x2 Anticipated MOD:  NSVD  Julia [name] 02/15/2013, 2:31 PM

## 2013-02-15 NOTE — Progress Notes (Signed)
Julia [name] is a 27 y.o. [id number] at [redacted]w[redacted]d by ultrasound admitted for induction of labor due to Monochorionic diamniotic twins, symmetric CHTN, GDM A1.,   Subjective: Pt tolerating labor easily, contractions q5. Fhr cat I   Objective: BP 142/92  Pulse 100  Temp(Src) 98.4 F (36.9 C) (Oral)  Resp 16  Ht 5\' 6"  (1.676 m)  Wt 92.987 kg (205 lb)  BMI 33.10 kg/m2  LMP 06/10/2012      FHT:  FHR: 150x2 bpm, variability: moderate,  accelerations:  Present,  decelerations:  Absent UC:   regular, every 5 minutes SVE:   Dilation: 6 Effacement (%): 70 Station: -1 Exam by:: Dr Emelda Fear AROM. Clear fluid with vernix. Labs: Lab Results  Component Value Date   WBC 6.8 02/15/2013   HGB 11.4* 02/15/2013   HCT 33.3* 02/15/2013   MCV 88.6 02/15/2013   PLT 178 02/15/2013    Assessment / Plan: Induction of labor due to twins, monochorionic diamniotic ,  progressing well on pitocin  Labor: Progressing normally Preeclampsia:  no sign Fetal Wellbeing:  Category I x2 Pain Control:  Labor support without medications I/D:  n/a Anticipated MOD:  NSVD  Clarkson Rosselli V 02/15/2013, 1:32 PM

## 2013-02-15 NOTE — Progress Notes (Signed)
Pt sitting at bedside.  RN having difficulty tracing both babies with pt in this position

## 2013-02-15 NOTE — Anesthesia Procedure Notes (Signed)
Epidural Patient location during procedure: OB Start time: 02/15/2013 2:15 PM End time: 02/15/2013 2:19 PM  Staffing Anesthesiologist: Leilani Able Performed by: anesthesiologist   Preanesthetic Checklist Completed: patient identified, surgical consent, pre-op evaluation, timeout performed, IV checked, risks and benefits discussed and monitors and equipment checked  Epidural Patient position: sitting Prep: site prepped and draped and DuraPrep Patient monitoring: continuous pulse ox and blood pressure Approach: midline Injection technique: LOR air  Needle:  Needle type: Tuohy  Needle gauge: 17 G Needle length: 9 cm and 9 Needle insertion depth: 6 cm Catheter type: closed end flexible Catheter size: 19 Gauge Catheter at skin depth: 11 cm Test dose: negative and Other  Assessment Sensory level: T9 Events: blood not aspirated, injection not painful, no injection resistance, negative IV test and no paresthesia  Additional Notes Reason for block:procedure for pain

## 2013-02-15 NOTE — Progress Notes (Signed)
Difficulty tracing "b" due to pt sitting straight up in bed

## 2013-02-15 NOTE — Anesthesia Preprocedure Evaluation (Signed)
Anesthesia Evaluation  Patient identified by MRN, date of birth, ID band Patient awake    Reviewed: Allergy & Precautions, H&P , NPO status , Patient's Chart, lab work & pertinent test results  Airway Mallampati: I TM Distance: >3 FB Neck ROM: full    Dental no notable dental hx.    Pulmonary neg pulmonary ROS, former smoker,    Pulmonary exam normal       Cardiovascular hypertension, negative cardio ROS      Neuro/Psych negative neurological ROS  negative psych ROS   GI/Hepatic negative GI ROS, Neg liver ROS,   Endo/Other  diabetes, Gestational  Renal/GU negative Renal ROS  negative genitourinary   Musculoskeletal   Abdominal Normal abdominal exam  (+)   Peds negative pediatric ROS (+)  Hematology negative hematology ROS (+)   Anesthesia Other Findings   Reproductive/Obstetrics (+) Pregnancy                           Anesthesia Physical Anesthesia Plan  ASA: II  Anesthesia Plan: Epidural   Post-op Pain Management:    Induction:   Airway Management Planned:   Additional Equipment:   Intra-op Plan:   Post-operative Plan:   Informed Consent: I have reviewed the patients History and Physical, chart, labs and discussed the procedure including the risks, benefits and alternatives for the proposed anesthesia with the patient or authorized representative who has indicated his/her understanding and acceptance.     Plan Discussed with:   Anesthesia Plan Comments:         Anesthesia Quick Evaluation

## 2013-02-16 LAB — GLUCOSE, CAPILLARY
Glucose-Capillary: 88 mg/dL (ref 70–99)
Glucose-Capillary: 88 mg/dL (ref 70–99)

## 2013-02-16 NOTE — Progress Notes (Signed)
Julia [name] is a 27 year old G2P0233. PPD #1. GBS positive.  Subjective: Pt delivered twin girls via NSVD/VAVD. No complaints this morning. Denies pain. Bleeding the amount of her normal period. Ambulating well. Tolerating PO intake. Voiding urine. No bowel movement yet but is passing flatus. Denies chest pain or shortness of breath.  Objective: Filed Vitals:   02/16/13 0530  BP: 136/92  Pulse: 84  Temp: 98 F (36.7 C)  Resp: 18   Exam unremarkable. General: Appears fatigued. No acute distress. Uterine Fundus: Firm and 2 cm below umbilicus. DVT/PE: No signs of symptoms of DVT or PE. Negative Homan's sign. No significant calf/ankle edema or pain.  Results for orders placed during the hospital encounter of 02/15/13 (from the past 24 hour(s))  GLUCOSE, CAPILLARY     Status: None   Collection Time    02/15/13  9:14 AM      Result Value Range   Glucose-Capillary 94  70 - 99 mg/dL  GLUCOSE, CAPILLARY     Status: None   Collection Time    02/15/13 12:08 PM      Result Value Range   Glucose-Capillary 88  70 - 99 mg/dL  GLUCOSE, CAPILLARY     Status: None   Collection Time    02/15/13 12:08 PM      Result Value Range   Glucose-Capillary 88  70 - 99 mg/dL  GLUCOSE, CAPILLARY     Status: Abnormal   Collection Time    02/15/13  6:09 PM      Result Value Range   Glucose-Capillary 131 (*) 70 - 99 mg/dL  CBC     Status: Abnormal   Collection Time    02/15/13  9:45 PM      Result Value Range   WBC 12.8 (*) 4.0 - 10.5 K/uL   RBC 3.44 (*) 3.87 - 5.11 MIL/uL   Hemoglobin 10.4 (*) 12.0 - 15.0 g/dL   HCT 16.1 (*) 09.6 - 04.5 %   MCV 89.0  78.0 - 100.0 fL   MCH 30.2  26.0 - 34.0 pg   MCHC 34.0  30.0 - 36.0 g/dL   RDW 40.9  81.1 - 91.4 %   Platelets 163  150 - 400 K/uL  COMPREHENSIVE METABOLIC PANEL     Status: Abnormal   Collection Time    02/15/13  9:45 PM      Result Value Range   Sodium 132 (*) 135 - 145 mEq/L   Potassium 3.8  3.5 - 5.1 mEq/L   Chloride 102  96 - 112  mEq/L   CO2 20  19 - 32 mEq/L   Glucose, Bld 140 (*) 70 - 99 mg/dL   BUN 5 (*) 6 - 23 mg/dL   Creatinine, Ser 7.82  0.50 - 1.10 mg/dL   Calcium 9.2  8.4 - 95.6 mg/dL   Total Protein 5.4 (*) 6.0 - 8.3 g/dL   Albumin 2.3 (*) 3.5 - 5.2 g/dL   AST 20  0 - 37 U/L   ALT 11  0 - 35 U/L   Alkaline Phosphatase 135 (*) 39 - 117 U/L   Total Bilirubin 0.3  0.3 - 1.2 mg/dL   GFR calc non Af Amer >90  >90 mL/min   GFR calc Af Amer >90  >90 mL/min   Assessment & Plan: Julia [name] is a 27 year old, PPD #1 who delivered twin girls yesterday. She will be reassessed tomorrow morning and her twin girls monitored for 48 hours post  delivery for GBS surveillance.  Pt will both breast and bottle feed. She desires an IUD.  Prepared by: Sherron Monday, PA-S.

## 2013-02-16 NOTE — Lactation Note (Signed)
This note was copied from the chart of Julia [name]. Lactation Consultation Note Attempted initial visit at 24 hours of age, but visit was shortened due to moms visitors.  Baby A just has a bottle of 15 mls formula.  Mom reports trying all night to get babies latched and she couldn't so they have been getting bottles of formula.  Baby is cuing and rooting.  I offered to assist with latch at this time mom declines.  Baby b had a bottle at 1500 of 15 mls asleep in visitors arms.    Patient Name: Julia [name] Today's Date: 02/16/2013 Reason for consult: Initial assessment   Maternal Data Formula Feeding for Exclusion: No Infant to breast within first hour of birth: Yes  Feeding Feeding Type: Breast Fed Nipple Type: Slow - flow  LATCH Score/Interventions                      Lactation Tools Discussed/Used     Consult Status Consult Status: Follow-up Date: 02/16/13 Follow-up type: In-patient    Johnell Bas Lynn 02/16/2013, 4:19 PM    

## 2013-02-16 NOTE — Anesthesia Postprocedure Evaluation (Signed)
  Anesthesia Post-op Note  Patient: Julia [name]  Procedure(s) Performed: * No procedures listed *  Patient Location: Mother/Baby  Anesthesia Type:Epidural  Level of Consciousness: awake, alert , oriented and patient cooperative  Airway and Oxygen Therapy: Patient Spontanous Breathing  Post-op Pain: mild  Post-op Assessment: Patient's Cardiovascular Status Stable, Respiratory Function Stable, No headache, No backache, No residual numbness and No residual motor weakness  Post-op Vital Signs: stable  Complications: No apparent anesthesia complications

## 2013-02-17 MED ORDER — IBUPROFEN 600 MG PO TABS: 600.0000 mg | ORAL_TABLET | Freq: Four times a day (QID) | ORAL | Status: DC

## 2013-02-17 NOTE — Discharge Summary (Signed)
Obstetric Discharge Summary Reason for Admission: induction of labor and twin gestation at [redacted]w[redacted]d Prenatal Procedures: NST and ultrasound Intrapartum Procedures: GBS prophylaxis and NSVD Twin A, VAVD Twin B Postpartum Procedures: none Complications-Operative and Postpartum: 2nd degree perineal laceration Hemoglobin  Date Value Range Status  02/15/2013 10.4* 12.0 - 15.0 g/dL Final     HCT  Date Value Range Status  02/15/2013 30.6* 36.0 - 46.0 % Final  Brief Hospital Course:  RHEALYNN MYHRE is a 27 y.o. now [id number] who delivered twins via NSVD/VAVD at [redacted]w[redacted]d. Twin A was delivered without complication, followed by Twin B who was delivered by VAVD after some fetal decelerations. NICU team was standing by and both newborns were resuscitated quickly and have had an uneventful postpartum course. GBS prophylaxis was administered and the twins are being observed for 48 hours for surveillance. Mother will breast and bottle feed and desires mirena for birth control.   Physical Exam:  General: alert, cooperative and no distress Lochia: appropriate Uterine Fundus: firm Incision: n/a DVT Evaluation: No evidence of DVT seen on physical exam. No cords or calf tenderness. No significant calf/ankle edema.  Discharge Diagnoses: Premature labor and monochorionic/diamniotic pregnancy  Discharge Information: Date: 02/17/2013 Activity: pelvic rest Diet: routine Medications: PNV and Ibuprofen Condition: stable Instructions: refer to practice specific booklet Discharge to: home Follow-up Information   Follow up with [facility] In 6 weeks.   Specialty:  Obstetrics and Gynecology   Contact information:   [address] [place] [address] [phone]      Newborn Data:   Danell, Vazquez [[id number]]  Live born female  Birth Weight: 6 lb 3.3 oz (2815 g) APGAR: 8, 9   Madgeline, Rayo Turkey [[id number]]  Live born female  Birth Weight: 6 lb 6.5 oz (2906 g) APGAR: 8,  8  Home with mother.  Hazeline Junker 02/17/2013, 7:34 AM  I have seen and examined this patient and agree with above documentation in the resident's note. Pt home today with twins and will f/u in 4-6 weeks as post partum.  Desires mirena.   Rulon Abide, M.D. [facility] Fellow 02/17/2013 9:40 AM

## 2013-02-21 NOTE — Progress Notes (Signed)
I have seen and examined this patient and I agree with the above. Cam Hai 12:29 AM 02/21/2013

## 2013-02-26 IMAGING — CR DG ANKLE COMPLETE 3+V*R*
3 series · 3 of 3 positions shown · non-contrast
Comparison: None.

CLINICAL DATA: Ankle injury.  Ankle pain and swelling.

RIGHT ANKLE - COMPLETE 3+ VIEW

[view not recorded (1 of 3)]
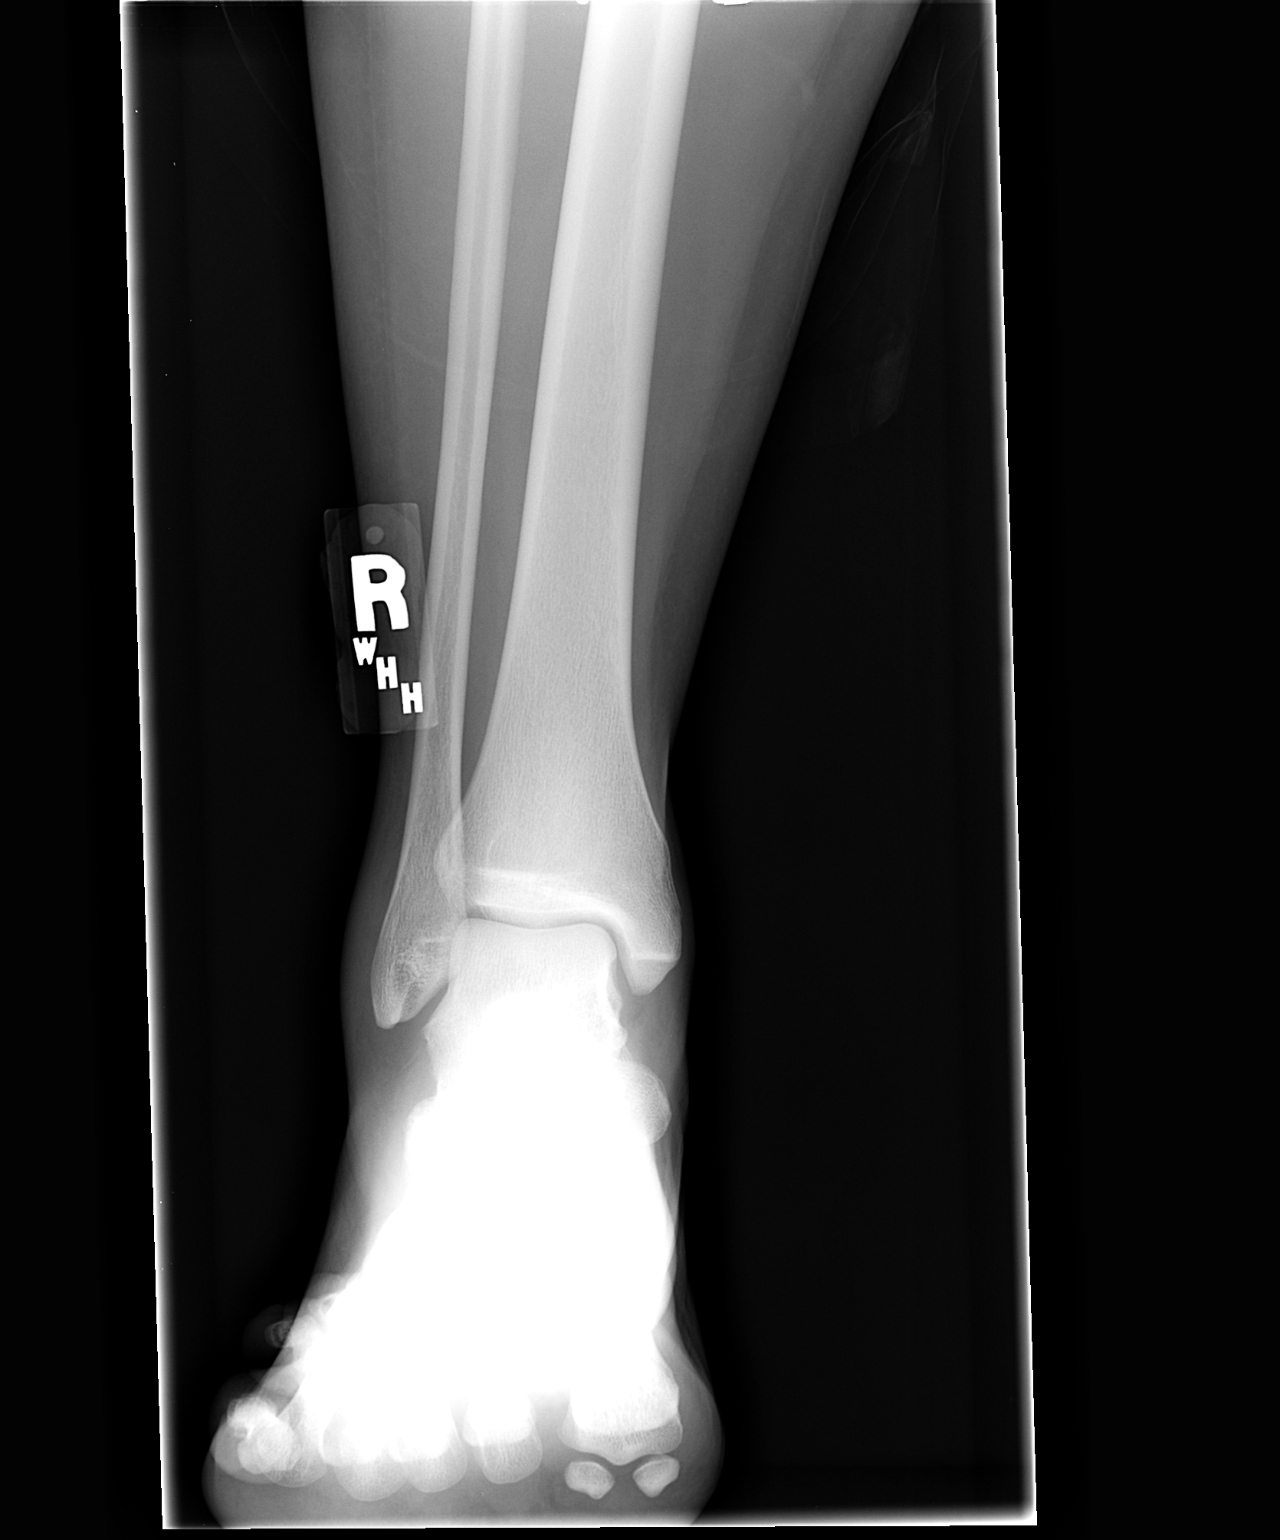

[view not recorded (2 of 3)]
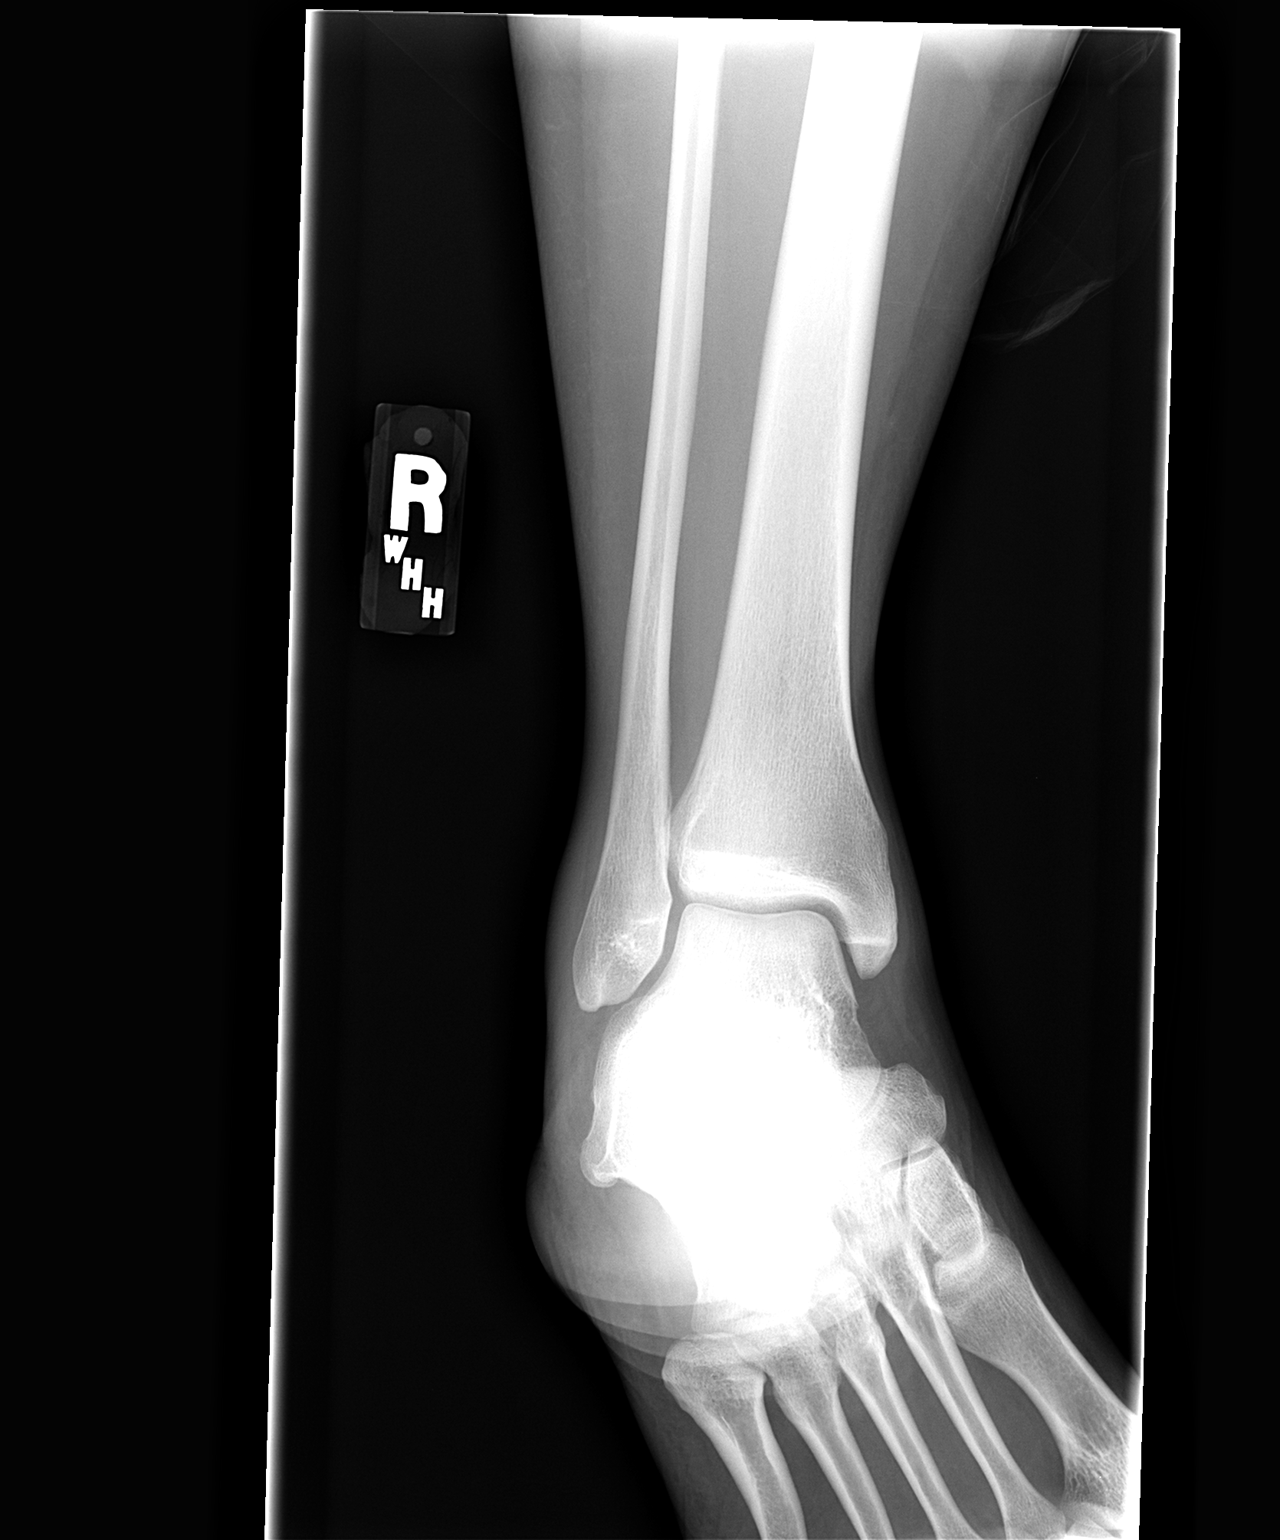

[view not recorded (3 of 3)]
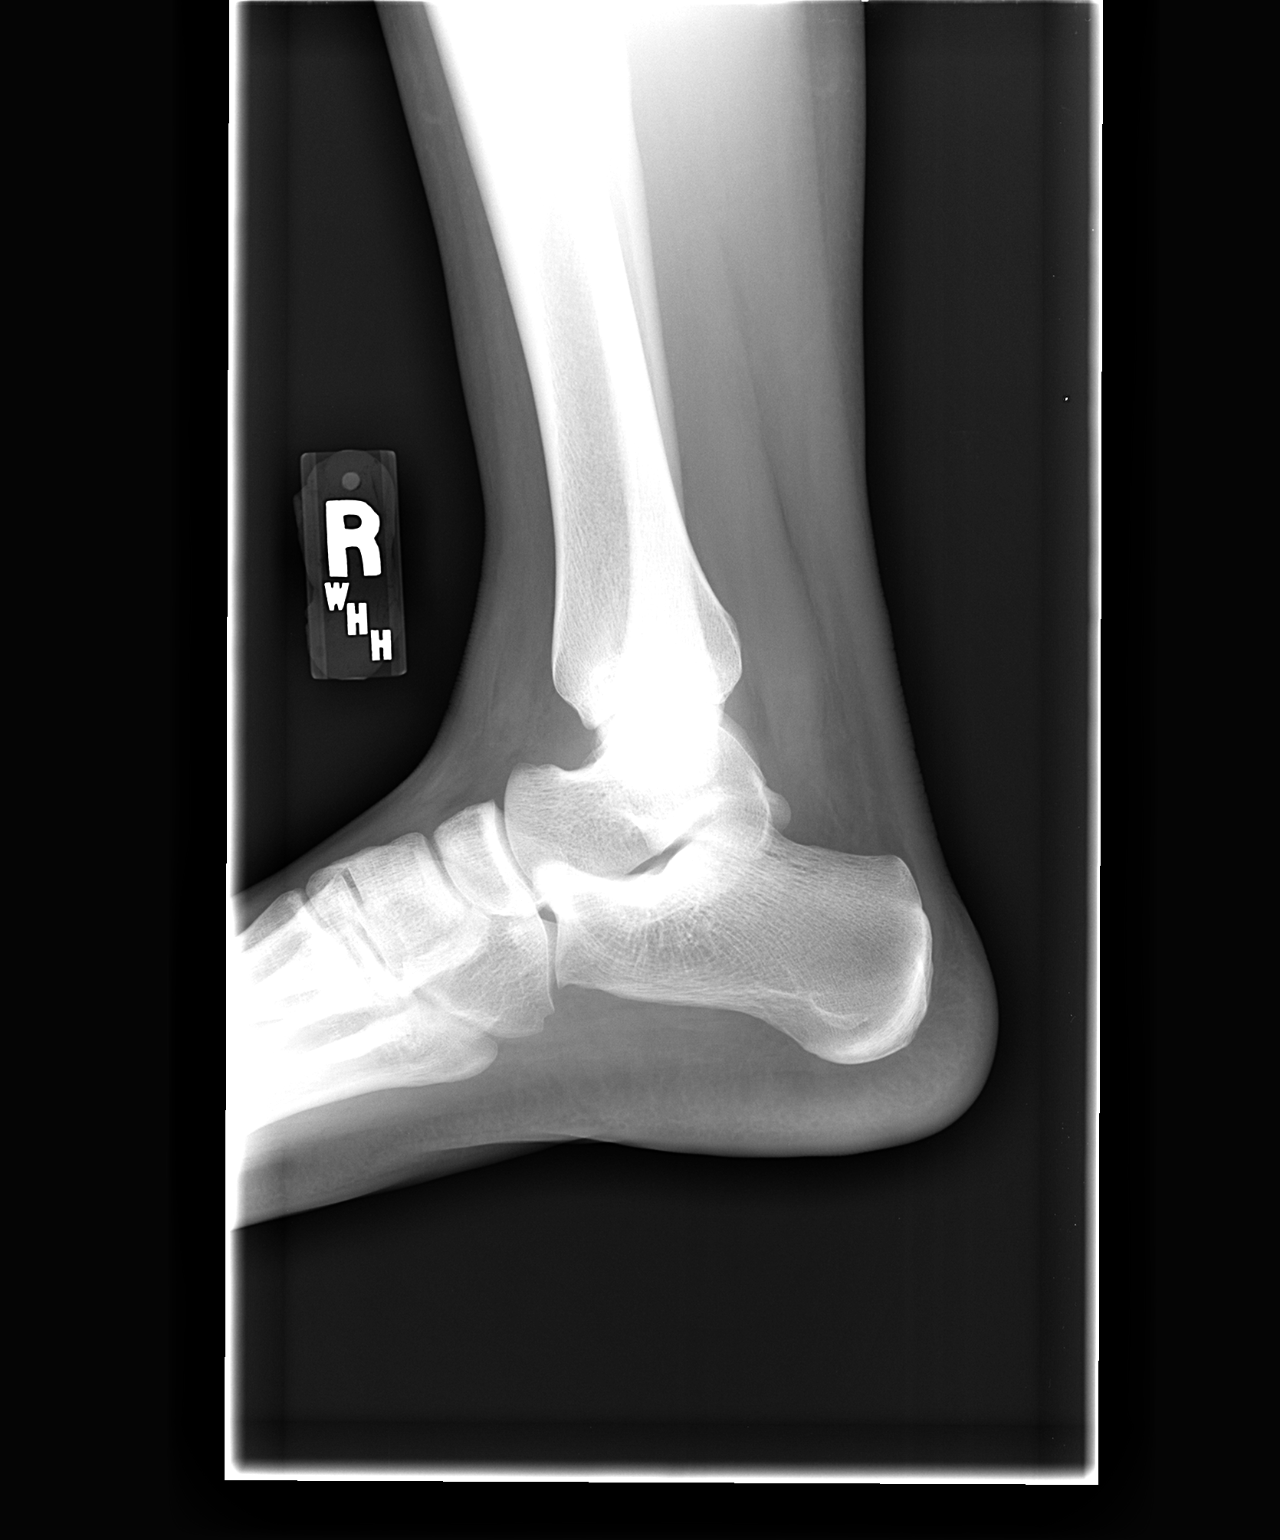

[3 of 3 positions shown; findings below may reference images not displayed]

FINDINGS: Soft tissue swelling is seen overlying the lateral
malleolus.  No ankle joint effusion identified.  No evidence of
fracture or dislocation.  No other significant bone abnormality
identified.
IMPRESSION: Lateral soft tissue swelling.  No evidence of fracture.

## 2013-03-04 ENCOUNTER — Telehealth: Payer: Self-pay | Admitting: *Deleted

## 2013-03-04 NOTE — Telephone Encounter (Signed)
Julia [name], [facility], called stating pt blood pressure 140/100, 130/90. Pt continues to take Aldomet 10 mg. Pt c/o cold and allergies taking Dayquil. Pt delivered twins on 02/15/2013, SVD.  Attempted to contact pt by phone to make an appt but no answer.

## 2013-03-09 NOTE — Telephone Encounter (Signed)
Contacted pt concerning elevated b/p as informed by Camillia Herter from the [facility] on 03/04/13. Pt states out of town and will not be able to come in for an appt. Pt states no symptoms of blurred vision or headaches, also states was sick with cough and taking Dayquil when elevated blood pressures were noted. Per Dr. Emelda Fear if pt begins to have headaches, blurred vision, pt to go to ER. Pt verbalized understanding and stated would keep her appt on 03/20/12 for postpartum.

## 2013-03-20 ENCOUNTER — Ambulatory Visit: Payer: Managed Care, Other (non HMO) | Admitting: Obstetrics and Gynecology

## 2013-03-23 ENCOUNTER — Encounter: Payer: Self-pay | Admitting: Obstetrics and Gynecology

## 2013-03-23 ENCOUNTER — Ambulatory Visit (INDEPENDENT_AMBULATORY_CARE_PROVIDER_SITE_OTHER): Payer: Managed Care, Other (non HMO) | Admitting: Obstetrics and Gynecology

## 2013-03-23 ENCOUNTER — Ambulatory Visit: Payer: Managed Care, Other (non HMO) | Admitting: Obstetrics and Gynecology

## 2013-03-23 DIAGNOSIS — Z309 Encounter for contraceptive management, unspecified: Secondary | ICD-10-CM | POA: Insufficient documentation

## 2013-03-23 NOTE — Progress Notes (Signed)
Patient ID: Julia MenghiniVictoria R [name], female   DOB: 15-Jan-1986, 28 y.o.   MRN: [id number]   Subjective:  Julia MenghiniVictoria R [name] is a 28 y.o. female who presents for a 5 weeks postpartum visit  Patient concerns:postpartum care, otherwise no other complaints at this time.  Prenatal and intrapartum course notable for twin delivery  svd A, vacuum B   Patient is not sexually active.   The following portions of the patient's history were reviewed and updated as appropriate: allergies, current medications, past family history, past medical history, past surgical history and problem list.  Review of Systems    See Subjective, otherwise negative ROS.  Objective:  BP 114/68  Ht 5\' 6"  (1.676 m)  Wt 173 lb (78.472 kg)  BMI 27.94 kg/m2  Breastfeeding? Yes  General:  alert, cooperative and no distress     Lungs: clear to auscultation bilaterally  Heart:  regular rate and rhythm, S1, S2 normal, no murmur  Abdomen: soft, non-tender; bowel sounds normal; no masses,  no organomegaly   Vulva:  normal  Vagina: normal vagina  Cervix:  normal  Corpus: normal size, contour, position, consistency, mobility, non-tender  Adnexa:  normal adnexa          Assessment:  1.  postpartum exam.  2. Contraception: has an appointment next week for an IUD.  3.    Plan:  mirena IUD next wk.

## 2013-03-23 NOTE — Patient Instructions (Signed)
Levonorgestrel intrauterine device (IUD) What is this medicine? LEVONORGESTREL IUD (LEE voe nor jes trel) is a contraceptive (birth control) device. The device is placed inside the uterus by a healthcare professional. It is used to prevent pregnancy and can also be used to treat heavy bleeding that occurs during your period. Depending on the device, it can be used for 3 to 5 years. This medicine may be used for other purposes; ask your health care provider or pharmacist if you have questions. COMMON BRAND NAME(S): Mirena, Skyla What should I tell my health care provider before I take this medicine? They need to know if you have any of these conditions: -abnormal Pap smear -cancer of the breast, uterus, or cervix -diabetes -endometritis -genital or pelvic infection now or in the past -have more than one sexual partner or your partner has more than one partner -heart disease -history of an ectopic or tubal pregnancy -immune system problems -IUD in place -liver disease or tumor -problems with blood clots or take blood-thinners -use intravenous drugs -uterus of unusual shape -vaginal bleeding that has not been explained -an unusual or allergic reaction to levonorgestrel, other hormones, silicone, or polyethylene, medicines, foods, dyes, or preservatives -pregnant or trying to get pregnant -breast-feeding How should I use this medicine? This device is placed inside the uterus by a health care professional. Talk to your pediatrician regarding the use of this medicine in children. Special care may be needed. Overdosage: If you think you have taken too much of this medicine contact a poison control center or emergency room at once. NOTE: This medicine is only for you. Do not share this medicine with others. What if I miss a dose? This does not apply. What may interact with this medicine? Do not take this medicine with any of the following  medications: -amprenavir -bosentan -fosamprenavir This medicine may also interact with the following medications: -aprepitant -barbiturate medicines for inducing sleep or treating seizures -bexarotene -griseofulvin -medicines to treat seizures like carbamazepine, ethotoin, felbamate, oxcarbazepine, phenytoin, topiramate -modafinil -pioglitazone -rifabutin -rifampin -rifapentine -some medicines to treat HIV infection like atazanavir, indinavir, lopinavir, nelfinavir, tipranavir, ritonavir -St. Makenli Derstine's wort -warfarin This list may not describe all possible interactions. Give your health care provider a list of all the medicines, herbs, non-prescription drugs, or dietary supplements you use. Also tell them if you smoke, drink alcohol, or use illegal drugs. Some items may interact with your medicine. What should I watch for while using this medicine? Visit your doctor or health care professional for regular check ups. See your doctor if you or your partner has sexual contact with others, becomes HIV positive, or gets a sexual transmitted disease. This product does not protect you against HIV infection (AIDS) or other sexually transmitted diseases. You can check the placement of the IUD yourself by reaching up to the top of your vagina with clean fingers to feel the threads. Do not pull on the threads. It is a good habit to check placement after each menstrual period. Call your doctor right away if you feel more of the IUD than just the threads or if you cannot feel the threads at all. The IUD may come out by itself. You may become pregnant if the device comes out. If you notice that the IUD has come out use a backup birth control method like condoms and call your health care provider. Using tampons will not change the position of the IUD and are okay to use during your period. What side effects may I   notice from receiving this medicine? Side effects that you should report to your doctor or  health care professional as soon as possible: -allergic reactions like skin rash, itching or hives, swelling of the face, lips, or tongue -fever, flu-like symptoms -genital sores -high blood pressure -no menstrual period for 6 weeks during use -pain, swelling, warmth in the leg -pelvic pain or tenderness -severe or sudden headache -signs of pregnancy -stomach cramping -sudden shortness of breath -trouble with balance, talking, or walking -unusual vaginal bleeding, discharge -yellowing of the eyes or skin Side effects that usually do not require medical attention (report to your doctor or health care professional if they continue or are bothersome): -acne -breast pain -change in sex drive or performance -changes in weight -cramping, dizziness, or faintness while the device is being inserted -headache -irregular menstrual bleeding within first 3 to 6 months of use -nausea This list may not describe all possible side effects. Call your doctor for medical advice about side effects. You may report side effects to FDA at 1-800-FDA-1088. Where should I keep my medicine? This does not apply. NOTE: This sheet is a summary. It may not cover all possible information. If you have questions about this medicine, talk to your doctor, pharmacist, or health care provider.  2014, Elsevier/Gold Standard. (2011-04-05 13:54:04)  

## 2013-03-30 ENCOUNTER — Encounter: Payer: Self-pay | Admitting: Obstetrics and Gynecology

## 2013-03-30 ENCOUNTER — Ambulatory Visit (INDEPENDENT_AMBULATORY_CARE_PROVIDER_SITE_OTHER): Payer: Managed Care, Other (non HMO) | Admitting: Obstetrics and Gynecology

## 2013-03-30 VITALS — BP 118/82 | Ht 66.0 in | Wt 173.8 lb

## 2013-03-30 DIAGNOSIS — Z30430 Encounter for insertion of intrauterine contraceptive device: Secondary | ICD-10-CM

## 2013-03-30 DIAGNOSIS — Z309 Encounter for contraceptive management, unspecified: Secondary | ICD-10-CM

## 2013-03-30 DIAGNOSIS — Z3202 Encounter for pregnancy test, result negative: Secondary | ICD-10-CM

## 2013-03-30 LAB — POCT URINE PREGNANCY: Preg Test, Ur: NEGATIVE

## 2013-03-30 NOTE — Patient Instructions (Signed)
Levonorgestrel intrauterine device (IUD) What is this medicine? LEVONORGESTREL IUD (LEE voe nor jes trel) is a contraceptive (birth control) device. The device is placed inside the uterus by a healthcare professional. It is used to prevent pregnancy and can also be used to treat heavy bleeding that occurs during your period. Depending on the device, it can be used for 3 to 5 years. This medicine may be used for other purposes; ask your health care provider or pharmacist if you have questions. COMMON BRAND NAME(S): Mirena, Skyla What should I tell my health care provider before I take this medicine? They need to know if you have any of these conditions: -abnormal Pap smear -cancer of the breast, uterus, or cervix -diabetes -endometritis -genital or pelvic infection now or in the past -have more than one sexual partner or your partner has more than one partner -heart disease -history of an ectopic or tubal pregnancy -immune system problems -IUD in place -liver disease or tumor -problems with blood clots or take blood-thinners -use intravenous drugs -uterus of unusual shape -vaginal bleeding that has not been explained -an unusual or allergic reaction to levonorgestrel, other hormones, silicone, or polyethylene, medicines, foods, dyes, or preservatives -pregnant or trying to get pregnant -breast-feeding How should I use this medicine? This device is placed inside the uterus by a health care professional. Talk to your pediatrician regarding the use of this medicine in children. Special care may be needed. Overdosage: If you think you have taken too much of this medicine contact a poison control center or emergency room at once. NOTE: This medicine is only for you. Do not share this medicine with others. What if I miss a dose? This does not apply. What may interact with this medicine? Do not take this medicine with any of the following  medications: -amprenavir -bosentan -fosamprenavir This medicine may also interact with the following medications: -aprepitant -barbiturate medicines for inducing sleep or treating seizures -bexarotene -griseofulvin -medicines to treat seizures like carbamazepine, ethotoin, felbamate, oxcarbazepine, phenytoin, topiramate -modafinil -pioglitazone -rifabutin -rifampin -rifapentine -some medicines to treat HIV infection like atazanavir, indinavir, lopinavir, nelfinavir, tipranavir, ritonavir -St. Jemarcus Dougal's wort -warfarin This list may not describe all possible interactions. Give your health care provider a list of all the medicines, herbs, non-prescription drugs, or dietary supplements you use. Also tell them if you smoke, drink alcohol, or use illegal drugs. Some items may interact with your medicine. What should I watch for while using this medicine? Visit your doctor or health care professional for regular check ups. See your doctor if you or your partner has sexual contact with others, becomes HIV positive, or gets a sexual transmitted disease. This product does not protect you against HIV infection (AIDS) or other sexually transmitted diseases. You can check the placement of the IUD yourself by reaching up to the top of your vagina with clean fingers to feel the threads. Do not pull on the threads. It is a good habit to check placement after each menstrual period. Call your doctor right away if you feel more of the IUD than just the threads or if you cannot feel the threads at all. The IUD may come out by itself. You may become pregnant if the device comes out. If you notice that the IUD has come out use a backup birth control method like condoms and call your health care provider. Using tampons will not change the position of the IUD and are okay to use during your period. What side effects may I   notice from receiving this medicine? Side effects that you should report to your doctor or  health care professional as soon as possible: -allergic reactions like skin rash, itching or hives, swelling of the face, lips, or tongue -fever, flu-like symptoms -genital sores -high blood pressure -no menstrual period for 6 weeks during use -pain, swelling, warmth in the leg -pelvic pain or tenderness -severe or sudden headache -signs of pregnancy -stomach cramping -sudden shortness of breath -trouble with balance, talking, or walking -unusual vaginal bleeding, discharge -yellowing of the eyes or skin Side effects that usually do not require medical attention (report to your doctor or health care professional if they continue or are bothersome): -acne -breast pain -change in sex drive or performance -changes in weight -cramping, dizziness, or faintness while the device is being inserted -headache -irregular menstrual bleeding within first 3 to 6 months of use -nausea This list may not describe all possible side effects. Call your doctor for medical advice about side effects. You may report side effects to FDA at 1-800-FDA-1088. Where should I keep my medicine? This does not apply. NOTE: This sheet is a summary. It may not cover all possible information. If you have questions about this medicine, talk to your doctor, pharmacist, or health care provider.  2014, Elsevier/Gold Standard. (2011-04-05 13:54:04)  

## 2013-03-30 NOTE — Progress Notes (Signed)
This chart was transcribed for Dr. Christin BachJohn Brelyn Woehl by Leone PayorSonum Patel, ED scribe.    GYNECOLOGY CLINIC PROCEDURE NOTE  Julia MenghiniVictoria R Hailu is a 28 y.o. [id number] here for Mirena IUD insertion. No GYN concerns.  Last pap smear was on 08/2012 and was normal, with trich which was treated previously   Pt reports giving birth about 6 weeks ago and had her last period 2 weeks ago. She states she has been both breast and bottle feeding.   IUD Insertion Procedure Note Patient identified, informed consent performed.  Discussed risks of irregular bleeding, cramping, infection, malpositioning or misplacement of the IUD outside the uterus which may require further procedures. Time out was performed.  Urine pregnancy test negative.  Speculum placed in the vagina.  Cervix visualized.  Cleaned with Betadine x 2.  Grasped anteriorly with a single tooth tenaculum.  Uterus sounded to 9 cm.  Mirena IUD placed per manufacturer's recommendations.  Strings trimmed to 3 cm. Tenaculum was removed, good hemostasis noted.  Patient tolerated procedure well.  Post procedure u/s ( no chg) was done to confirm position, which was good  Patient was given post-procedure instructions.  She was advised to be have backup contraception for one week.  Patient was also asked to check IUD strings periodically and follow up in 4 weeks for IUD check.

## 2013-04-16 ENCOUNTER — Telehealth: Payer: Self-pay | Admitting: Obstetrics & Gynecology

## 2013-04-16 MED ORDER — B & B CARPAL TUNNEL BRACE MISC: Status: DC

## 2013-04-16 NOTE — Telephone Encounter (Signed)
[facility] pharmacist states unable to call in order can fax RX. RX faxed. Pt aware.

## 2013-04-16 NOTE — Telephone Encounter (Signed)
Pt states does not have a PCP, would Dr. Despina HiddenEure give a RX for a brace for pt carpal tunnel in right arm. Pt states Dr. Despina HiddenEure RX one during her pregnancy.

## 2013-04-16 NOTE — Telephone Encounter (Signed)
OK to calll in a prescription for a left carpal tunnel brace, can't really e prescribe it

## 2013-04-27 ENCOUNTER — Ambulatory Visit: Payer: Managed Care, Other (non HMO) | Admitting: Obstetrics and Gynecology

## 2013-04-30 ENCOUNTER — Encounter: Payer: Self-pay | Admitting: Obstetrics and Gynecology

## 2013-04-30 ENCOUNTER — Ambulatory Visit (INDEPENDENT_AMBULATORY_CARE_PROVIDER_SITE_OTHER): Payer: Managed Care, Other (non HMO) | Admitting: Obstetrics and Gynecology

## 2013-04-30 VITALS — BP 120/80 | Ht 66.0 in | Wt 174.8 lb

## 2013-04-30 DIAGNOSIS — Z30431 Encounter for routine checking of intrauterine contraceptive device: Secondary | ICD-10-CM

## 2013-04-30 NOTE — Progress Notes (Signed)
GYNECOLOGY CLINIC PROGRESS NOTE  History:  28 y.o. [id number] here today for today for IUD string check; Mirena IUD was placed  03/30/13. No complaints about the Mirena, no concerning side effects.  The following portions of the patient's history were reviewed and updated as appropriate: allergies, current medications, past family history, past medical history, past social history, past surgical history and problem list. Last pap smear on 08/2012 was normal, negative for HRHPV but + for trich  Review of Systems:  Pertinent items are noted in HPI.  Objective:  Physical Exam Blood pressure 120/80, height 5\' 6"  (1.676 m), weight 174 lb 12.8 oz (79.289 kg), last menstrual period 04/01/2013, currently breastfeeding. Gen: NAD Abd: Soft, nontender and nondistended Pelvic: Not indicated, pt reports no problems, and left w/o pelvic.  Assessment & Plan:  Normal IUD check. Patient to keep IUD in place for five years; can come in for removal if she desires pregnancy within the next five years. Routine preventative health maintenance measures emphasized.

## 2013-05-28 ENCOUNTER — Emergency Department (HOSPITAL_COMMUNITY)
Admission: EM | Admit: 2013-05-28 | Discharge: 2013-05-28 | Disposition: A | Discharge status: Home / Self Care | Payer: Managed Care, Other (non HMO) | Attending: Emergency Medicine | Admitting: Emergency Medicine

## 2013-05-28 ENCOUNTER — Encounter (HOSPITAL_COMMUNITY): Payer: Self-pay | Admitting: Emergency Medicine

## 2013-05-28 DIAGNOSIS — K5289 Other specified noninfective gastroenteritis and colitis: Secondary | ICD-10-CM | POA: Insufficient documentation

## 2013-05-28 DIAGNOSIS — J3489 Other specified disorders of nose and nasal sinuses: Secondary | ICD-10-CM | POA: Insufficient documentation

## 2013-05-28 DIAGNOSIS — Z87891 Personal history of nicotine dependence: Secondary | ICD-10-CM | POA: Insufficient documentation

## 2013-05-28 DIAGNOSIS — Z8619 Personal history of other infectious and parasitic diseases: Secondary | ICD-10-CM | POA: Insufficient documentation

## 2013-05-28 DIAGNOSIS — K529 Noninfective gastroenteritis and colitis, unspecified: Secondary | ICD-10-CM

## 2013-05-28 DIAGNOSIS — Z3202 Encounter for pregnancy test, result negative: Secondary | ICD-10-CM | POA: Insufficient documentation

## 2013-05-28 DIAGNOSIS — Z79899 Other long term (current) drug therapy: Secondary | ICD-10-CM | POA: Insufficient documentation

## 2013-05-28 LAB — BASIC METABOLIC PANEL
BUN: 5 mg/dL — ABNORMAL LOW (ref 6–23)
CO2: 28 mEq/L (ref 19–32)
CREATININE: 0.72 mg/dL (ref 0.50–1.10)
Calcium: 8.9 mg/dL (ref 8.4–10.5)
Chloride: 101 mEq/L (ref 96–112)
GFR calc Af Amer: >90 mL/min (ref >90)
GFR calc non Af Amer: >90 mL/min (ref >90)
Glucose, Bld: 99 mg/dL (ref 70–99)
POTASSIUM: 3.9 meq/L (ref 3.7–5.3)
Sodium: 138 mEq/L (ref 137–147)

## 2013-05-28 LAB — URINALYSIS, ROUTINE W REFLEX MICROSCOPIC
Bilirubin Urine: NEGATIVE
GLUCOSE, UA: NEGATIVE mg/dL
Ketones, ur: NEGATIVE mg/dL
LEUKOCYTES UA: NEGATIVE
Nitrite: NEGATIVE
PH: 6 (ref 5.0–8.0)
Protein, ur: NEGATIVE mg/dL
SPECIFIC GRAVITY, URINE: 1.01 (ref 1.005–1.030)
Urobilinogen, UA: 0.2 mg/dL (ref 0.0–1.0)

## 2013-05-28 LAB — URINE MICROSCOPIC-ADD ON

## 2013-05-28 LAB — POC URINE PREG, ED: PREG TEST UR: NEGATIVE

## 2013-05-28 MED ORDER — ONDANSETRON 8 MG PO TBDP
8.0000 mg | ORAL_TABLET | Freq: Once | ORAL | Status: AC
  Administered 2013-05-28: 8 mg via ORAL
  Filled 2013-05-28: qty 1

## 2013-05-28 MED ORDER — ACETAMINOPHEN 500 MG PO TABS
1000.0000 mg | ORAL_TABLET | Freq: Once | ORAL | Status: AC
  Administered 2013-05-28: 1000 mg via ORAL
  Filled 2013-05-28: qty 2

## 2013-05-28 MED ORDER — ONDANSETRON HCL 8 MG PO TABS: 8.0000 mg | ORAL_TABLET | Freq: Three times a day (TID) | ORAL | Status: DC | PRN

## 2013-05-28 NOTE — Discharge Instructions (Signed)
Viral Gastroenteritis °Viral gastroenteritis is also called stomach flu. This illness is caused by a certain type of germ (virus). It can cause sudden watery poop (diarrhea) and throwing up (vomiting). This can cause you to lose body fluids (dehydration). This illness usually lasts for 3 to 8 days. It usually goes away on its own. °HOME CARE  °· Drink enough fluids to keep your pee (urine) clear or pale yellow. Drink small amounts of fluids often. °· Ask your doctor how to replace body fluid losses (rehydration). °· Avoid: °· Foods high in sugar. °· Alcohol. °· Bubbly (carbonated) drinks. °· Tobacco. °· Juice. °· Caffeine drinks. °· Very hot or cold fluids. °· Fatty, greasy foods. °· Eating too much at one time. °· Dairy products until 24 to 48 hours after your watery poop stops. °· You may eat foods with active cultures (probiotics). They can be found in some yogurts and supplements. °· Wash your hands well to avoid spreading the illness. °· Only take medicines as told by your doctor. Do not give aspirin to children. Do not take medicines for watery poop (antidiarrheals). °· Ask your doctor if you should keep taking your regular medicines. °· Keep all doctor visits as told. °GET HELP RIGHT AWAY IF:  °· You cannot keep fluids down. °· You do not pee at least once every 6 to 8 hours. °· You are short of breath. °· You see blood in your poop or throw up. This may look like coffee grounds. °· You have belly (abdominal) pain that gets worse or is just in one small spot (localized). °· You keep throwing up or having watery poop. °· You have a fever. °· The patient is a child younger than 3 months, and he or she has a fever. °· The patient is a child older than 3 months, and he or she has a fever and problems that do not go away. °· The patient is a child older than 3 months, and he or she has a fever and problems that suddenly get worse. °· The patient is a baby, and he or she has no tears when crying. °MAKE SURE YOU:    °· Understand these instructions. °· Will watch your condition. °· Will get help right away if you are not doing well or get worse. °Document Released: 08/22/2007 Document Revised: 05/28/2011 Document Reviewed: 12/20/2010 °[employer]® Patient Information ©2014 [employer]. ° °

## 2013-05-28 NOTE — ED Notes (Signed)
Pt has already been examined by PA.  Alert,

## 2013-05-28 NOTE — ED Notes (Signed)
Patient complaining of headache, body aches, and nausea that started this morning.

## 2013-05-31 NOTE — ED Provider Notes (Signed)
CSN: [id number]     Arrival date & time 05/28/13  1938 History   First MD Initiated Contact with Patient 05/28/13 2129     Chief Complaint  Patient presents with  . Generalized Body Aches  . Nausea     (Consider location/radiation/quality/duration/timing/severity/associated sxs/prior Treatment) HPI Comments: Julia [name] is a 28 y.o. Female presenting with a 12 hour history of flu like symptoms which includes  low grade fever, body aches, headache, nausea with emesis x 1 and several episodes of loose but not frankly diarrheal stools.  Symptoms due to not include  neck pain, shortness of breath, chest pain or abdominal pain.  The patient has taken no medicines for her symptoms and has found no alleviation from her complaints.  She was exposed to a friends child yesterday with similar symptoms.     The history is provided by the patient.    Past Medical History  Diagnosis Date  . Preeclampsia   . Hx of trichomoniasis   . Pregnancy induced hypertension   . Gestational diabetes     diet controlled with G1  . HSV-2 seropositive    Past Surgical History  Procedure Laterality Date  . Breast surgery     Family History  Problem Relation Age of Onset  . Hypertension Mother   . Cancer Maternal Grandmother   . Heart attack Maternal Grandfather   . Asthma Son   . Arthritis Neg Hx   . Alcohol abuse Neg Hx   . Birth defects Neg Hx   . COPD Neg Hx   . Depression Neg Hx   . Diabetes Neg Hx   . Drug abuse Neg Hx   . Early death Neg Hx   . Hearing loss Neg Hx   . Heart disease Neg Hx   . Hyperlipidemia Neg Hx   . Kidney disease Neg Hx   . Learning disabilities Neg Hx   . Mental illness Neg Hx   . Mental retardation Neg Hx   . Miscarriages / Stillbirths Neg Hx   . Stroke Neg Hx   . Vision loss Neg Hx   . Varicose Veins Neg Hx    History  Substance Use Topics  . Smoking status: Former Smoker -- 2 years    Types: Cigarettes  . Smokeless tobacco: Never Used  . Alcohol Use:  No     Comment: occ.; not now   OB History   Grav Para Term Preterm Abortions TAB SAB Ect Mult Living   5 2  2 3  3  1 3      Review of Systems  Constitutional: Positive for fever.  HENT: Negative for congestion, ear pain, rhinorrhea, sinus pressure, sore throat, trouble swallowing and voice change.   Eyes: Negative for discharge.  Respiratory: Negative for cough, shortness of breath, wheezing and stridor.   Cardiovascular: Negative for chest pain.  Gastrointestinal: Positive for nausea, vomiting and diarrhea. Negative for abdominal pain.  Genitourinary: Negative.       Allergies  Review of patient's allergies indicates no known allergies.  Home Medications   Current Outpatient Rx  Name  Route  Sig  Dispense  Refill  . acyclovir (ZOVIRAX) 400 MG tablet   Oral   Take 1 tablet (400 mg total) by mouth 3 (three) times daily.   90 tablet   3   . Elastic Bandages & Supports (B & B CARPAL TUNNEL BRACE) MISC      Please fit for a brace on left hand  1 each   0   . ibuprofen (ADVIL,MOTRIN) 600 MG tablet   Oral   Take 1 tablet (600 mg total) by mouth every 6 (six) hours.   30 tablet   0   . methyldopa (ALDOMET) 500 MG tablet   Oral   Take 1 tablet (500 mg total) by mouth 2 (two) times daily.   120 tablet   3   . ondansetron (ZOFRAN) 8 MG tablet   Oral   Take 1 tablet (8 mg total) by mouth every 8 (eight) hours as needed for nausea or vomiting.   15 tablet   0   . Prenatal Vit-Fe Fumarate-FA (PRENATAL MULTIVITAMIN) TABS tablet   Oral   Take 1 tablet by mouth daily at 12 noon.          BP 121/63  Pulse 81  Temp(Src) 99 F (37.2 C) (Oral)  Resp 16  Ht 5\' 6"  (1.676 m)  Wt 170 lb (77.111 kg)  BMI 27.45 kg/m2  SpO2 98%  LMP 05/19/2013 Physical Exam  Constitutional: She is oriented to person, place, and time. She appears well-developed and well-nourished.  HENT:  Head: Normocephalic and atraumatic.  Right Ear: Tympanic membrane and ear canal normal.  Left  Ear: Tympanic membrane and ear canal normal.  Nose: Mucosal edema and rhinorrhea present.  Mouth/Throat: Uvula is midline, oropharynx is clear and moist and mucous membranes are normal. No oropharyngeal exudate, posterior oropharyngeal edema, posterior oropharyngeal erythema or tonsillar abscesses.  Eyes: Conjunctivae are normal.  Cardiovascular: Normal rate and normal heart sounds.   Pulmonary/Chest: Effort normal. No respiratory distress. She has no decreased breath sounds. She has no wheezes. She has no rhonchi. She has no rales.  Abdominal: Soft. Bowel sounds are normal. There is no tenderness. There is no rebound and no guarding.  Musculoskeletal: Normal range of motion.  Neurological: She is alert and oriented to person, place, and time.  Skin: Skin is warm and dry. No rash noted.  Psychiatric: She has a normal mood and affect.    ED Course  Procedures (including critical care time) Labs Review Labs Reviewed  URINALYSIS, ROUTINE W REFLEX MICROSCOPIC - Abnormal; Notable for the following:    Hgb urine dipstick TRACE (*)    All other components within normal limits  BASIC METABOLIC PANEL - Abnormal; Notable for the following:    BUN 5 (*)    All other components within normal limits  URINE MICROSCOPIC-ADD ON  POC URINE PREG, ED   Imaging Review No results found.   EKG Interpretation None      MDM   Final diagnoses:  Gastroenteritis    Pt was given zofran and tolerated po fluids, no emesis or diarrhea while in ed,  Encouraged rest, increased fluid intake,  Brat diet.  Prescribed zofran.  Prn f/u if sx worsen or fail to improve.  Patients labs and/or radiological studies were viewed and considered during the medical decision making and disposition process. The patient appears reasonably screened and/or stabilized for discharge and I doubt any other medical condition or other [facility] requiring further screening, evaluation, or treatment in the ED at this time prior to  discharge.     Burgess Amor, PA-C 05/31/13 0107

## 2013-06-01 NOTE — ED Provider Notes (Signed)
Medical screening examination/treatment/procedure(s) were performed by non-physician practitioner and as supervising physician I was immediately available for consultation/collaboration.   EKG Interpretation None        Brynnan Rodenbaugh L Shakiya Mcneary, MD 06/01/13 1437 

## 2013-08-13 ENCOUNTER — Ambulatory Visit (INDEPENDENT_AMBULATORY_CARE_PROVIDER_SITE_OTHER): Payer: Managed Care, Other (non HMO) | Admitting: Obstetrics and Gynecology

## 2013-08-13 DIAGNOSIS — Z538 Procedure and treatment not carried out for other reasons: Secondary | ICD-10-CM

## 2013-08-14 ENCOUNTER — Encounter: Payer: Self-pay | Admitting: Obstetrics and Gynecology

## 2013-08-14 ENCOUNTER — Ambulatory Visit (INDEPENDENT_AMBULATORY_CARE_PROVIDER_SITE_OTHER): Payer: Managed Care, Other (non HMO) | Admitting: Obstetrics and Gynecology

## 2013-08-14 VITALS — BP 120/78 | Wt 179.0 lb

## 2013-08-14 DIAGNOSIS — N898 Other specified noninflammatory disorders of vagina: Secondary | ICD-10-CM

## 2013-08-14 LAB — POCT WET PREP WITH KOH
Clue Cells Wet Prep HPF POC: POSITIVE
KOH Prep POC: NEGATIVE
Trichomonas, UA: NEGATIVE
YEAST WET PREP PER HPF POC: NEGATIVE

## 2013-08-14 MED ORDER — METRONIDAZOLE 500 MG PO TABS: 500.0000 mg | ORAL_TABLET | Freq: Two times a day (BID) | ORAL | Status: DC

## 2013-08-14 NOTE — Progress Notes (Signed)
Patient ID: Julia [name], female   DOB: [birth year], 28 y.o.   MRN: [id number]    [facility] Visit  Patient name: Julia [name] MRN [id number]  Date of birth: 07-01-1985  CC & HPI:  Julia [name] is a 28 y.o. female presenting today for an IUD followup. Patient notes some vaginal discharge present and menorrhagia. Patient has two 6 month olds  ROS:  +vaginal discharge +menorrhagia No other complaints  Pertinent History Reviewed:  Medical & Surgical Hx:  Reviewed: Significant for  Preeclampsia [642.40]  Medications: Reviewed & Updated - see associated section Social History: Reviewed -  reports that she has quit smoking. Her smoking use included Cigarettes. She smoked 0.00 packs per day for 2 years. She has never used smokeless tobacco.  Objective Findings:  Vitals: BP 120/78  Wt 179 lb (81.194 kg)  LMP 07/21/2013  Breastfeeding? No  Physical Examination:  Pelvic - normal external genitalia, vulva, vagina, cervix, uterus and adnexa, VULVA: normal appearing vulva with no masses, tenderness or lesions, VAGINA: normal appearing vagina with normal color and discharge, no lesions, CERVIX: normal appearing cervix without discharge or lesions   iud string present 1 cm long Wet test  Assessment & Plan:   A; BV,   P:plan Metronadazole 7 days BID

## 2013-08-14 NOTE — Patient Instructions (Signed)

## 2013-08-30 NOTE — Progress Notes (Signed)
Cancelled appt

## 2014-01-18 ENCOUNTER — Encounter: Payer: Self-pay | Admitting: Obstetrics and Gynecology

## 2014-06-04 ENCOUNTER — Other Ambulatory Visit: Payer: Managed Care, Other (non HMO) | Admitting: Obstetrics & Gynecology

## 2014-06-08 ENCOUNTER — Ambulatory Visit (INDEPENDENT_AMBULATORY_CARE_PROVIDER_SITE_OTHER): Payer: Medicaid Other | Admitting: Obstetrics & Gynecology

## 2014-06-08 ENCOUNTER — Other Ambulatory Visit (HOSPITAL_COMMUNITY)
Admission: RE | Admit: 2014-06-08 | Discharge: 2014-06-08 | Disposition: A | Discharge status: Home / Self Care | Payer: Medicaid Other | Source: Ambulatory Visit | Attending: Obstetrics & Gynecology | Admitting: Obstetrics & Gynecology

## 2014-06-08 ENCOUNTER — Encounter: Payer: Self-pay | Admitting: Obstetrics & Gynecology

## 2014-06-08 VITALS — BP 130/90 | HR 96 | Ht 66.0 in | Wt 174.0 lb

## 2014-06-08 DIAGNOSIS — Z0000 Encounter for general adult medical examination without abnormal findings: Secondary | ICD-10-CM

## 2014-06-08 DIAGNOSIS — Z01419 Encounter for gynecological examination (general) (routine) without abnormal findings: Secondary | ICD-10-CM | POA: Insufficient documentation

## 2014-06-08 MED ORDER — METHYLDOPA 500 MG PO TABS: 500.0000 mg | ORAL_TABLET | Freq: Two times a day (BID) | ORAL | Status: DC

## 2014-06-08 MED ORDER — ACYCLOVIR 400 MG PO TABS: 400.0000 mg | ORAL_TABLET | Freq: Three times a day (TID) | ORAL | Status: DC

## 2014-06-08 NOTE — Addendum Note (Signed)
Addended by: Lazaro ArmsEURE, Nima Bamburg H on: 06/08/2014 03:59 PM   Modules accepted: Orders

## 2014-06-08 NOTE — Progress Notes (Signed)
Patient ID: Julia [name], female   DOB: December 14, 1985, 29 y.o.   MRN: [id number] Subjective:     Julia [name] is a 29 y.o. female here for a routine exam.  No LMP recorded. Patient is not currently having periods (Reason: IUD). [id number] Birth Control Method:  Mirena Menstrual Calendar(currently): amenorrheic  Current complaints: none.   Current acute medical issues:  none   Recent Gynecologic History No LMP recorded. Patient is not currently having periods (Reason: IUD). Last Pap: 2015,  normal Last mammogram: ,    Past Medical History  Diagnosis Date  . Preeclampsia   . Hx of trichomoniasis   . Pregnancy induced hypertension   . Gestational diabetes     diet controlled with G1  . HSV-2 seropositive     Past Surgical History  Procedure Laterality Date  . Breast surgery      OB History    Gravida Para Term Preterm AB TAB SAB Ectopic Multiple Living   History   Social History  . Marital Status: Single    Spouse Name: N/A  . Number of Children: N/A  . Years of Education: N/A   Social History Main Topics  . Smoking status: Former Smoker -- 2 years    Types: Cigarettes  . Smokeless tobacco: Never Used  . Alcohol Use: No     Comment: occ.; not now  . Drug Use: No  . Sexual Activity: Yes    Birth Control/ Protection: IUD   Other Topics Concern  . None   Social History Narrative    Family History  Problem Relation Age of Onset  . Hypertension Mother   . Cancer Maternal Grandmother   . Heart attack Maternal Grandfather   . Asthma Son   . Arthritis Neg Hx   . Alcohol abuse Neg Hx   . Birth defects Neg Hx   . COPD Neg Hx   . Depression Neg Hx   . Diabetes Neg Hx   . Drug abuse Neg Hx   . Early death Neg Hx   . Hearing loss Neg Hx   . Heart disease Neg Hx   . Hyperlipidemia Neg Hx   . Kidney disease Neg Hx   . Learning disabilities Neg Hx   . Mental illness Neg Hx   . Mental retardation Neg Hx   . Miscarriages / Stillbirths  Neg Hx   . Stroke Neg Hx   . Vision loss Neg Hx   . Varicose Veins Neg Hx      Current outpatient prescriptions:  .  acyclovir (ZOVIRAX) 400 MG tablet, Take 1 tablet (400 mg total) by mouth 3 (three) times daily. (Patient not taking: Reported on 06/08/2014), Disp: 90 tablet, Rfl: 3 .  methyldopa (ALDOMET) 500 MG tablet, Take 1 tablet (500 mg total) by mouth 2 (two) times daily. (Patient not taking: Reported on 06/08/2014), Disp: 120 tablet, Rfl: 3 .  metroNIDAZOLE (FLAGYL) 500 MG tablet, Take 1 tablet (500 mg total) by mouth 2 (two) times daily. (Patient not taking: Reported on 06/08/2014), Disp: 14 tablet, Rfl: 0 .  Multiple Vitamin (MULTIVITAMIN) tablet, Take 1 tablet by mouth daily., Disp: , Rfl:   Review of Systems  Review of Systems  Constitutional: Negative for fever, chills, weight loss, malaise/fatigue and diaphoresis.  HENT: Negative for hearing loss, ear pain, nosebleeds, congestion, sore throat, neck pain, tinnitus and ear discharge.   Eyes: Negative for  blurred vision, double vision, photophobia, pain, discharge and redness.  Respiratory: Negative for cough, hemoptysis, sputum production, shortness of breath, wheezing and stridor.   Cardiovascular: Negative for chest pain, palpitations, orthopnea, claudication, leg swelling and PND.  Gastrointestinal: negative for abdominal pain. Negative for heartburn, nausea, vomiting, diarrhea, constipation, blood in stool and melena.  Genitourinary: Negative for dysuria, urgency, frequency, hematuria and flank pain.  Musculoskeletal: Negative for myalgias, back pain, joint pain and falls.  Skin: Negative for itching and rash.  Neurological: Negative for dizziness, tingling, tremors, sensory change, speech change, focal weakness, seizures, loss of consciousness, weakness and headaches.  Endo/Heme/Allergies: Negative for environmental allergies and polydipsia. Does not bruise/bleed easily.  Psychiatric/Behavioral: Negative for depression,  suicidal ideas, hallucinations, memory loss and substance abuse. The patient is not nervous/anxious and does not have insomnia.        Objective:  Blood pressure 130/90, pulse 96, height 5\' 6"  (1.676 m), weight 174 lb (78.926 kg), not currently breastfeeding.   Physical Exam  Vitals reviewed. Constitutional: She is oriented to person, place, and time. She appears well-developed and well-nourished.  HENT:  Head: Normocephalic and atraumatic.        Right Ear: External ear normal.  Left Ear: External ear normal.  Nose: Nose normal.  Mouth/Throat: Oropharynx is clear and moist.  Eyes: Conjunctivae and EOM are normal. Pupils are equal, round, and reactive to light. Right eye exhibits no discharge. Left eye exhibits no discharge. No scleral icterus.  Neck: Normal range of motion. Neck supple. No tracheal deviation present. No thyromegaly present.  Cardiovascular: Normal rate, regular rhythm, normal heart sounds and intact distal pulses.  Exam reveals no gallop and no friction rub.   No murmur heard. Respiratory: Effort normal and breath sounds normal. No respiratory distress. She has no wheezes. She has no rales. She exhibits no tenderness.  GI: Soft. Bowel sounds are normal. She exhibits no distension and no mass. There is no tenderness. There is no rebound and no guarding.  Genitourinary:  Breasts no masses skin changes or nipple changes bilaterally      Vulva is normal without lesions Vagina is pink moist without discharge Cervix normal in appearance and pap is done, strings of IUD visible Uterus is normal size shape and contour Adnexa is negative with normal sized ovaries   Musculoskeletal: Normal range of motion. She exhibits no edema and no tenderness.  Neurological: She is alert and oriented to person, place, and time. She has normal reflexes. She displays normal reflexes. No cranial nerve deficit. She exhibits normal muscle tone. Coordination normal.  Skin: Skin is warm and dry. No  rash noted. No erythema. No pallor.  Psychiatric: She has a normal mood and affect. Her behavior is normal. Judgment and thought content normal.       Assessment:    Healthy female exam.    Plan:    Contraception: IUD. Follow up in: 1 year.

## 2014-06-10 LAB — CYTOLOGY - PAP

## 2015-10-31 ENCOUNTER — Other Ambulatory Visit (HOSPITAL_COMMUNITY)
Admission: RE | Admit: 2015-10-31 | Discharge: 2015-10-31 | Disposition: A | Discharge status: Home / Self Care | Payer: Medicaid Other | Source: Ambulatory Visit | Attending: Obstetrics & Gynecology | Admitting: Obstetrics & Gynecology

## 2015-10-31 ENCOUNTER — Ambulatory Visit (INDEPENDENT_AMBULATORY_CARE_PROVIDER_SITE_OTHER): Payer: Medicaid Other | Admitting: Obstetrics & Gynecology

## 2015-10-31 ENCOUNTER — Encounter: Payer: Self-pay | Admitting: Obstetrics & Gynecology

## 2015-10-31 VITALS — BP 140/108 | HR 78 | Ht 66.0 in | Wt 193.0 lb

## 2015-10-31 DIAGNOSIS — Z01419 Encounter for gynecological examination (general) (routine) without abnormal findings: Secondary | ICD-10-CM | POA: Insufficient documentation

## 2015-10-31 DIAGNOSIS — Z0000 Encounter for general adult medical examination without abnormal findings: Secondary | ICD-10-CM

## 2015-10-31 DIAGNOSIS — I10 Essential (primary) hypertension: Secondary | ICD-10-CM

## 2015-10-31 MED ORDER — LISINOPRIL-HYDROCHLOROTHIAZIDE 10-12.5 MG PO TABS: 1.0000 | ORAL_TABLET | Freq: Every day | ORAL | 11 refills | Status: DC

## 2015-10-31 NOTE — Progress Notes (Signed)
Subjective:     Julia [name] is a 30 y.o. female here for a routine exam.  No LMP recorded. Patient is not currently having periods (Reason: IUD). [id number] Birth Control Method:  IUD Menstrual Calendar(currently): amenorrheic on IUD  Current complaints: none.   Current acute medical issues:  Hypertension, did not take her meds this am   Recent Gynecologic History No LMP recorded. Patient is not currently having periods (Reason: IUD). Last Pap: 2015,  normal Last mammogram: ,    Past Medical History:  Diagnosis Date  . Gestational diabetes    diet controlled with G1  . HSV-2 seropositive   . Hx of trichomoniasis   . Hypertension   . Preeclampsia   . Pregnancy induced hypertension     Past Surgical History:  Procedure Laterality Date  . BREAST SURGERY      OB History    Gravida Para Term Preterm AB Living   5 2   2 3 3    SAB TAB Ectopic Multiple Live Births   3     1 3       Social History   Social History  . Marital status: Single    Spouse name: N/A  . Number of children: N/A  . Years of education: N/A   Social History Main Topics  . Smoking status: Former Smoker    Years: 2.00    Types: Cigarettes  . Smokeless tobacco: Never Used  . Alcohol use Yes     Comment: occ  . Drug use: No  . Sexual activity: Yes    Partners: Male    Birth control/ protection: IUD   Other Topics Concern  . None   Social History Narrative  . None    Family History  Problem Relation Age of Onset  . Hypertension Mother   . Cancer Maternal Grandmother   . Heart attack Maternal Grandfather   . Asthma Son   . Arthritis Neg Hx   . Alcohol abuse Neg Hx   . Birth defects Neg Hx   . COPD Neg Hx   . Depression Neg Hx   . Diabetes Neg Hx   . Drug abuse Neg Hx   . Early death Neg Hx   . Hearing loss Neg Hx   . Heart disease Neg Hx   . Hyperlipidemia Neg Hx   . Kidney disease Neg Hx   . Learning disabilities Neg Hx   . Mental illness Neg Hx   . Mental retardation Neg  Hx   . Miscarriages / Stillbirths Neg Hx   . Stroke Neg Hx   . Vision loss Neg Hx   . Varicose Veins Neg Hx      Current Outpatient Prescriptions:  .  acyclovir (ZOVIRAX) 400 MG tablet, Take 1 tablet (400 mg total) by mouth 3 (three) times daily., Disp: 90 tablet, Rfl: 3 .  methyldopa (ALDOMET) 500 MG tablet, Take 1 tablet (500 mg total) by mouth 2 (two) times daily., Disp: 120 tablet, Rfl: 3 .  Multiple Vitamin (MULTIVITAMIN) tablet, Take 1 tablet by mouth daily., Disp: , Rfl:  .  lisinopril-hydrochlorothiazide (ZESTORETIC) 10-12.5 MG tablet, Take 1 tablet by mouth daily., Disp: 30 tablet, Rfl: 11  Review of Systems  Review of Systems  Constitutional: Negative for fever, chills, weight loss, malaise/fatigue and diaphoresis.  HENT: Negative for hearing loss, ear pain, nosebleeds, congestion, sore throat, neck pain, tinnitus and ear discharge.   Eyes: Negative for blurred vision, double vision, photophobia, pain, discharge and redness.  Respiratory: Negative for cough, hemoptysis, sputum production, shortness of breath, wheezing and stridor.   Cardiovascular: Negative for chest pain, palpitations, orthopnea, claudication, leg swelling and PND.  Gastrointestinal: negative for abdominal pain. Negative for heartburn, nausea, vomiting, diarrhea, constipation, blood in stool and melena.  Genitourinary: Negative for dysuria, urgency, frequency, hematuria and flank pain.  Musculoskeletal: Negative for myalgias, back pain, joint pain and falls.  Skin: Negative for itching and rash.  Neurological: Negative for dizziness, tingling, tremors, sensory change, speech change, focal weakness, seizures, loss of consciousness, weakness and headaches.  Endo/Heme/Allergies: Negative for environmental allergies and polydipsia. Does not bruise/bleed easily.  Psychiatric/Behavioral: Negative for depression, suicidal ideas, hallucinations, memory loss and substance abuse. The patient is not nervous/anxious and  does not have insomnia.        Objective:  Blood pressure (!) 140/108, pulse 78, height 5\' 6"  (1.676 m), weight 193 lb (87.5 kg).   Physical Exam  Vitals reviewed. Constitutional: She is oriented to person, place, and time. She appears well-developed and well-nourished.  HENT:  Head: Normocephalic and atraumatic.        Right Ear: External ear normal.  Left Ear: External ear normal.  Nose: Nose normal.  Mouth/Throat: Oropharynx is clear and moist.  Eyes: Conjunctivae and EOM are normal. Pupils are equal, round, and reactive to light. Right eye exhibits no discharge. Left eye exhibits no discharge. No scleral icterus.  Neck: Normal range of motion. Neck supple. No tracheal deviation present. No thyromegaly present.  Cardiovascular: Normal rate, regular rhythm, normal heart sounds and intact distal pulses.  Exam reveals no gallop and no friction rub.   No murmur heard. Respiratory: Effort normal and breath sounds normal. No respiratory distress. She has no wheezes. She has no rales. She exhibits no tenderness.  GI: Soft. Bowel sounds are normal. She exhibits no distension and no mass. There is no tenderness. There is no rebound and no guarding.  Genitourinary:  Breasts no masses skin changes or nipple changes bilaterally      Vulva is normal without lesions Vagina is pink moist without discharge Cervix normal in appearance and pap is done Uterus is normal size shape and contour Adnexa is negative with normal sized ovaries   Musculoskeletal: Normal range of motion. She exhibits no edema and no tenderness.  Neurological: She is alert and oriented to person, place, and time. She has normal reflexes. She displays normal reflexes. No cranial nerve deficit. She exhibits normal muscle tone. Coordination normal.  Skin: Skin is warm and dry. No rash noted. No erythema. No pallor.  Psychiatric: She has a normal mood and affect. Her behavior is normal. Judgment and thought content normal.        Medications Ordered at today's visit: Meds ordered this encounter  Medications  . lisinopril-hydrochlorothiazide (ZESTORETIC) 10-12.5 MG tablet    Sig: Take 1 tablet by mouth daily.    Dispense:  30 tablet    Refill:  11    Other orders placed at today's visit: No orders of the defined types were placed in this encounter.     Assessment:    Healthy female exam.    Plan:    Contraception: IUD. Follow up in: 1 month.     Return in about 1 month (around 12/01/2015) for Follow up, with Dr Despina HiddenEure BP check.

## 2015-11-02 LAB — CYTOLOGY - PAP

## 2015-12-01 ENCOUNTER — Ambulatory Visit (INDEPENDENT_AMBULATORY_CARE_PROVIDER_SITE_OTHER): Payer: Medicaid Other | Admitting: Obstetrics & Gynecology

## 2015-12-01 ENCOUNTER — Encounter: Payer: Self-pay | Admitting: Obstetrics & Gynecology

## 2015-12-01 VITALS — BP 130/80 | HR 78 | Wt 193.0 lb

## 2015-12-01 DIAGNOSIS — I10 Essential (primary) hypertension: Secondary | ICD-10-CM

## 2015-12-01 DIAGNOSIS — Z975 Presence of (intrauterine) contraceptive device: Secondary | ICD-10-CM | POA: Diagnosis not present

## 2015-12-01 MED ORDER — LOSARTAN POTASSIUM-HCTZ 100-12.5 MG PO TABS: 1.0000 | ORAL_TABLET | Freq: Every day | ORAL | 11 refills | Status: DC

## 2015-12-01 NOTE — Progress Notes (Signed)
Chief Complaint  Patient presents with  . Blood Pressure Check    Not taking Lisinopril, did research on medicine.    Blood pressure 130/80, pulse 78, weight 193 lb (87.5 kg).  30 y.o. [id number] No LMP recorded. Patient is not currently having periods (Reason: IUD). The current method of family planning is IUD.  Outpatient Encounter Prescriptions as of 12/01/2015  Medication Sig  . acyclovir (ZOVIRAX) 400 MG tablet Take 1 tablet (400 mg total) by mouth 3 (three) times daily.  . methyldopa (ALDOMET) 500 MG tablet Take 1 tablet (500 mg total) by mouth 2 (two) times daily.  . Multiple Vitamin (MULTIVITAMIN) tablet Take 1 tablet by mouth daily.  Marland Kitchen. losartan-hydrochlorothiazide (HYZAAR) 100-12.5 MG tablet Take 1 tablet by mouth daily.  . [DISCONTINUED] lisinopril-hydrochlorothiazide (ZESTORETIC) 10-12.5 MG tablet Take 1 tablet by mouth daily. (Patient not taking: Reported on 12/01/2015)   No facility-administered encounter medications on file as of 12/01/2015.     Subjective Pt needs chronic BP med therapy but decided she did not want to take lisinopril but decided she did not want to take it after all    Objective   Pertinent ROS   Labs or studies     Impression Diagnoses this Encounter::   ICD-9-CM ICD-10-CM   1. Essential hypertension 401.9 I10     Established relevant diagnosis(es):   Plan/Recommendations: Meds ordered this encounter  Medications  . losartan-hydrochlorothiazide (HYZAAR) 100-12.5 MG tablet    Sig: Take 1 tablet by mouth daily.    Dispense:  30 tablet    Refill:  11    Labs or Scans Ordered: No orders of the defined types were placed in this encounter.   Management:: Mother has been on lisinopril for years and pt would rather not tke it  Will start losartan(ARB) and HCTZ combo  Follow up No Follow-up on file.        Face to face time:  15 minutes  Greater than 50% of the visit time was spent in counseling and coordination of  care with the patient.  The summary and outline of the counseling and care coordination is summarized in the note above.   All questions were answered.  Past Medical History:  Diagnosis Date  . Gestational diabetes    diet controlled with G1  . HSV-2 seropositive   . Hx of trichomoniasis   . Hypertension   . Preeclampsia   . Pregnancy induced hypertension     Past Surgical History:  Procedure Laterality Date  . BREAST SURGERY      OB History    Gravida Para Term Preterm AB Living   5 2   2 3 3    SAB TAB Ectopic Multiple Live Births   3     1 3       No Known Allergies  Social History   Social History  . Marital status: Single    Spouse name: N/A  . Number of children: N/A  . Years of education: N/A   Social History Main Topics  . Smoking status: Former Smoker    Years: 2.00    Types: Cigarettes  . Smokeless tobacco: Never Used  . Alcohol use Yes     Comment: occ  . Drug use: No  . Sexual activity: Yes    Partners: Male    Birth control/ protection: IUD   Other Topics Concern  . None   Social History Narrative  . None    Family History  Problem Relation Age of Onset  . Hypertension Mother   . Cancer Maternal Grandmother   . Heart attack Maternal Grandfather   . Asthma Son   . Arthritis Neg Hx   . Alcohol abuse Neg Hx   . Birth defects Neg Hx   . COPD Neg Hx   . Depression Neg Hx   . Diabetes Neg Hx   . Drug abuse Neg Hx   . Early death Neg Hx   . Hearing loss Neg Hx   . Heart disease Neg Hx   . Hyperlipidemia Neg Hx   . Kidney disease Neg Hx   . Learning disabilities Neg Hx   . Mental illness Neg Hx   . Mental retardation Neg Hx   . Miscarriages / Stillbirths Neg Hx   . Stroke Neg Hx   . Vision loss Neg Hx   . Varicose Veins Neg Hx

## 2015-12-29 ENCOUNTER — Other Ambulatory Visit: Payer: Self-pay | Admitting: Obstetrics & Gynecology

## 2016-01-06 ENCOUNTER — Ambulatory Visit: Payer: Medicaid Other | Admitting: Women's Health

## 2016-01-09 ENCOUNTER — Ambulatory Visit (INDEPENDENT_AMBULATORY_CARE_PROVIDER_SITE_OTHER): Payer: Medicaid Other | Admitting: Women's Health

## 2016-01-09 ENCOUNTER — Encounter: Payer: Self-pay | Admitting: Women's Health

## 2016-01-09 VITALS — BP 138/90 | HR 72 | Wt 194.0 lb

## 2016-01-09 DIAGNOSIS — Z8632 Personal history of gestational diabetes: Secondary | ICD-10-CM | POA: Insufficient documentation

## 2016-01-09 DIAGNOSIS — Z975 Presence of (intrauterine) contraceptive device: Secondary | ICD-10-CM | POA: Diagnosis not present

## 2016-01-09 DIAGNOSIS — N898 Other specified noninflammatory disorders of vagina: Secondary | ICD-10-CM | POA: Diagnosis not present

## 2016-01-09 DIAGNOSIS — I10 Essential (primary) hypertension: Secondary | ICD-10-CM | POA: Insufficient documentation

## 2016-01-09 DIAGNOSIS — N760 Acute vaginitis: Secondary | ICD-10-CM

## 2016-01-09 DIAGNOSIS — B9689 Other specified bacterial agents as the cause of diseases classified elsewhere: Secondary | ICD-10-CM

## 2016-01-09 LAB — POCT WET PREP (WET MOUNT)
Clue Cells Wet Prep Whiff POC: POSITIVE
Trichomonas Wet Prep HPF POC: ABSENT

## 2016-01-09 MED ORDER — METRONIDAZOLE 500 MG PO TABS: 500.0000 mg | ORAL_TABLET | Freq: Two times a day (BID) | ORAL | 0 refills | Status: DC

## 2016-01-09 NOTE — Patient Instructions (Signed)

## 2016-01-09 NOTE — Progress Notes (Signed)
   [facility] Visit  Patient name: Julia MenghiniVictoria R Blumenfeld MRN [id number]  Date of birth: 19-Aug-1985  CC & HPI:  Julia MenghiniVictoria R Sandy is a 30 y.o. [phone] African American female presenting today for vag d/c x 2wks, thinks it's yeast infection, maybe mild itching.  No LMP recorded. Patient is not currently having periods (Reason: IUD). The current method of family planning is IUD. Last pap Aug 2017, neg  Pertinent History Reviewed:  Medical & Surgical Hx:   Past medical, surgical, family, and social history reviewed in electronic medical record Medications: Reviewed & Updated - see associated section Allergies: Reviewed in electronic medical record  Objective Findings:  Vitals: BP 138/90 (BP Location: Left Arm, Patient Position: Sitting, Cuff Size: Normal)   Pulse 72   Wt 194 lb (88 kg)   BMI 31.31 kg/m  Body mass index is 31.31 kg/m.  Physical Examination: General appearance - alert, well appearing, and in no distress Pelvic - IUD strings visible, short, scant amt thin white malodorous d/c  Results for orders placed or performed in visit on 01/09/16 (from the past 24 hour(s))  POCT Wet Prep Mellody Drown(Wet [place])   Collection Time: 01/09/16  4:20 PM  Result Value Ref Range   Source Wet Prep POC vaginal    WBC, Wet Prep HPF POC none    Bacteria Wet Prep HPF POC None None, Few, Too numerous to count   BACTERIA WET PREP MORPHOLOGY POC     Clue Cells Wet Prep HPF POC Moderate (A) None, Too numerous to count   Clue Cells Wet Prep Whiff POC Positive Whiff    Yeast Wet Prep HPF POC None    KOH Wet Prep POC     Trichomonas Wet Prep HPF POC Absent Absent     Assessment & Plan:  A:   BV  P:  Rx metronidazole 500mg  BID x 7d for BV, no sex or etoh while taking   GC/CT from urine  Return for aug for physical.  Marge DuncansBooker, Donivin Wirt Randall CNM, WHNP-BC 01/09/2016 4:21 PM

## 2016-01-11 LAB — GC/CHLAMYDIA PROBE AMP
Chlamydia trachomatis, NAA: NEGATIVE
Neisseria gonorrhoeae by PCR: NEGATIVE

## 2016-01-23 ENCOUNTER — Telehealth: Payer: Self-pay | Admitting: Obstetrics & Gynecology

## 2016-01-23 NOTE — Telephone Encounter (Signed)
Pt states taking losartan-hydrochlorothiazide 100-12.5 mg requires PA.  Spoke with Joselyn Glassmanyler, pharmacist at [facility], he states Lisinopril is what [facility] prefers. Please advise.

## 2016-01-23 NOTE — Telephone Encounter (Signed)
Patient's mother was on it for years and developed idiopathic angioedema and she would rather not take it for that reasona dn I agree with that as well

## 2016-01-24 NOTE — Telephone Encounter (Signed)
PA completed on [place] for Losartan-HCTZ, waiting for a response.

## 2016-01-24 NOTE — Telephone Encounter (Signed)
Pt informed PA completed today will call pt once I get the results of the PA.

## 2016-03-02 ENCOUNTER — Encounter: Payer: Self-pay | Admitting: Adult Health

## 2016-03-02 ENCOUNTER — Ambulatory Visit (INDEPENDENT_AMBULATORY_CARE_PROVIDER_SITE_OTHER): Payer: Medicaid Other | Admitting: Adult Health

## 2016-03-02 VITALS — BP 124/76 | HR 80 | Ht 67.0 in | Wt 196.0 lb

## 2016-03-02 DIAGNOSIS — I10 Essential (primary) hypertension: Secondary | ICD-10-CM | POA: Diagnosis not present

## 2016-03-02 MED ORDER — HYDROCHLOROTHIAZIDE 12.5 MG PO CAPS: 12.5000 mg | ORAL_CAPSULE | Freq: Every day | ORAL | 6 refills | Status: DC

## 2016-03-02 MED ORDER — LOSARTAN POTASSIUM 100 MG PO TABS: 100.0000 mg | ORAL_TABLET | Freq: Every day | ORAL | 6 refills | Status: DC

## 2016-03-02 MED ORDER — LISINOPRIL 10 MG PO TABS: 10.0000 mg | ORAL_TABLET | Freq: Every day | ORAL | 6 refills | Status: DC

## 2016-03-02 NOTE — Progress Notes (Addendum)
Subjective:     Patient ID: Julia [name], female   DOB: 1985-04-06, 30 y.o.   MRN: [id number]  HPI Julia [name] is a 30 year old black female in for follow up on BP, stopped aldomet in September and was waiting on other BP meds to be approved and still has not heard anything so has not been on meds.Saw Dr Despina HiddenEure in September  Review of Systems  Patient denies any headaches, hearing loss, fatigue, blurred vision, shortness of breath, chest pain, abdominal pain, problems with bowel movements, urination, or intercourse. No joint pain or mood swings. Reviewed past medical,surgical, social and family history. Reviewed medications and allergies.     Objective:   Physical Exam BP 124/76 (BP Location: Left Arm, Patient Position: Sitting, Cuff Size: Large)   Pulse 80   Ht 5\' 7"  (1.702 m)   Wt 196 lb (88.9 kg)   BMI 30.70 kg/m  Skin warm and dry.  Lungs: clear to ausculation bilaterally. Cardiovascular: regular rate and rhythm.   PHQ 2 score 0. Pt aware goal 120/70, will reorder meds separately.  Assessment:     1. Essential hypertension       Plan:     Meds ordered this encounter  Medications  . losartan (COZAAR) 100 MG tablet    Sig: Take 1 tablet (100 mg total) by mouth daily.    Dispense:  30 tablet    Refill:  6    Order Specific Question:   Supervising Provider    Answer:   Despina HiddenEURE, LUTHER H [[id number]]  . hydrochlorothiazide (MICROZIDE) 12.5 MG capsule    Sig: Take 1 capsule (12.5 mg total) by mouth daily.    Dispense:  30 capsule    Refill:  6    Order Specific Question:   Supervising Provider    Answer:   Duane LopeEURE, LUTHER H [[id number]]  Check BP at work  Follow up in 4 weeks with me for BP check    Addend: Pharmacy called back Medicaid will not cover ARB til failed ACE, will D/C losartan and rx lisinopril 10 mg #30 take 1 daily with 6 refills.

## 2016-03-02 NOTE — Addendum Note (Signed)
Addended by: Cyril MourningGRIFFIN, Shaianne Nucci A on: 03/02/2016 10:37 AM   Modules accepted: Orders

## 2016-03-30 ENCOUNTER — Ambulatory Visit: Payer: Medicaid Other | Admitting: Adult Health

## 2016-04-02 ENCOUNTER — Ambulatory Visit: Payer: Medicaid Other | Admitting: Adult Health

## 2017-03-25 ENCOUNTER — Other Ambulatory Visit: Payer: Self-pay | Admitting: Adult Health

## 2017-03-25 MED ORDER — LISINOPRIL 10 MG PO TABS: 10.0000 mg | ORAL_TABLET | Freq: Every day | ORAL | 0 refills | Status: DC

## 2017-03-25 MED ORDER — HYDROCHLOROTHIAZIDE 12.5 MG PO CAPS: 12.5000 mg | ORAL_CAPSULE | Freq: Every day | ORAL | 0 refills | Status: DC

## 2017-03-25 NOTE — Progress Notes (Signed)
Refilled BP meds x1

## 2017-04-18 ENCOUNTER — Encounter: Payer: Self-pay | Admitting: Adult Health

## 2017-04-18 ENCOUNTER — Other Ambulatory Visit: Payer: Self-pay

## 2017-04-18 ENCOUNTER — Ambulatory Visit (INDEPENDENT_AMBULATORY_CARE_PROVIDER_SITE_OTHER): Payer: 59 | Admitting: Adult Health

## 2017-04-18 VITALS — BP 116/64 | HR 89 | Resp 16 | Ht 64.5 in | Wt 210.0 lb

## 2017-04-18 DIAGNOSIS — Z309 Encounter for contraceptive management, unspecified: Secondary | ICD-10-CM

## 2017-04-18 DIAGNOSIS — I10 Essential (primary) hypertension: Secondary | ICD-10-CM | POA: Diagnosis not present

## 2017-04-18 DIAGNOSIS — Z8632 Personal history of gestational diabetes: Secondary | ICD-10-CM | POA: Diagnosis not present

## 2017-04-18 DIAGNOSIS — Z975 Presence of (intrauterine) contraceptive device: Secondary | ICD-10-CM | POA: Diagnosis not present

## 2017-04-18 DIAGNOSIS — Z01411 Encounter for gynecological examination (general) (routine) with abnormal findings: Secondary | ICD-10-CM

## 2017-04-18 DIAGNOSIS — Z113 Encounter for screening for infections with a predominantly sexual mode of transmission: Secondary | ICD-10-CM | POA: Diagnosis not present

## 2017-04-18 DIAGNOSIS — Z30430 Encounter for insertion of intrauterine contraceptive device: Secondary | ICD-10-CM | POA: Insufficient documentation

## 2017-04-18 DIAGNOSIS — R635 Abnormal weight gain: Secondary | ICD-10-CM | POA: Insufficient documentation

## 2017-04-18 DIAGNOSIS — Z01419 Encounter for gynecological examination (general) (routine) without abnormal findings: Secondary | ICD-10-CM

## 2017-04-18 DIAGNOSIS — Z3009 Encounter for other general counseling and advice on contraception: Secondary | ICD-10-CM

## 2017-04-18 DIAGNOSIS — Z131 Encounter for screening for diabetes mellitus: Secondary | ICD-10-CM | POA: Insufficient documentation

## 2017-04-18 HISTORY — DX: Presence of (intrauterine) contraceptive device: Z97.5

## 2017-04-18 MED ORDER — LISINOPRIL 10 MG PO TABS: 10.0000 mg | ORAL_TABLET | Freq: Every day | ORAL | 4 refills | Status: DC

## 2017-04-18 MED ORDER — ACYCLOVIR 400 MG PO TABS: 400.0000 mg | ORAL_TABLET | Freq: Three times a day (TID) | ORAL | 6 refills | Status: DC

## 2017-04-18 MED ORDER — HYDROCHLOROTHIAZIDE 12.5 MG PO CAPS: 12.5000 mg | ORAL_CAPSULE | Freq: Every day | ORAL | 4 refills | Status: DC

## 2017-04-18 NOTE — Patient Instructions (Signed)
Calorie Counting for Weight Loss Calories are units of energy. Your body needs a certain amount of calories from food to keep you going throughout the day. When you eat more calories than your body needs, your body stores the extra calories as fat. When you eat fewer calories than your body needs, your body burns fat to get the energy it needs. Calorie counting means keeping track of how many calories you eat and drink each day. Calorie counting can be helpful if you need to lose weight. If you make sure to eat fewer calories than your body needs, you should lose weight. Ask your health care provider what a healthy weight is for you. For calorie counting to work, you will need to eat the right number of calories in a day in order to lose a healthy amount of weight per week. A dietitian can help you determine how many calories you need in a day and will give you suggestions on how to reach your calorie goal.  A healthy amount of weight to lose per week is usually 1-2 lb (0.5-0.9 kg). This usually means that your daily calorie intake should be reduced by 500-750 calories.  Eating 1,200 - 1,500 calories per day can help most women lose weight.  Eating 1,500 - 1,800 calories per day can help most men lose weight.  What is my plan? My goal is to have __________ calories per day. If I have this many calories per day, I should lose around __________ pounds per week. What do I need to know about calorie counting? In order to meet your daily calorie goal, you will need to:  Find out how many calories are in each food you would like to eat. Try to do this before you eat.  Decide how much of the food you plan to eat.  Write down what you ate and how many calories it had. Doing this is called keeping a food log.  To successfully lose weight, it is important to balance calorie counting with a healthy lifestyle that includes regular activity. Aim for 150 minutes of moderate exercise (such as walking) or 75  minutes of vigorous exercise (such as running) each week. Where do I find calorie information?  The number of calories in a food can be found on a Nutrition Facts label. If a food does not have a Nutrition Facts label, try to look up the calories online or ask your dietitian for help. Remember that calories are listed per serving. If you choose to have more than one serving of a food, you will have to multiply the calories per serving by the amount of servings you plan to eat. For example, the label on a package of bread might say that a serving size is 1 slice and that there are 90 calories in a serving. If you eat 1 slice, you will have eaten 90 calories. If you eat 2 slices, you will have eaten 180 calories. How do I keep a food log? Immediately after each meal, record the following information in your food log:  What you ate. Don't forget to include toppings, sauces, and other extras on the food.  How much you ate. This can be measured in cups, ounces, or number of items.  How many calories each food and drink had.  The total number of calories in the meal.  Keep your food log near you, such as in a small notebook in your pocket, or use a mobile app or website. Some   programs will calculate calories for you and show you how many calories you have left for the day to meet your goal. What are some calorie counting tips?  Use your calories on foods and drinks that will fill you up and not leave you hungry: ? Some examples of foods that fill you up are nuts and nut butters, vegetables, lean proteins, and high-fiber foods like whole grains. High-fiber foods are foods with more than 5 g fiber per serving. ? Drinks such as sodas, specialty coffee drinks, alcohol, and juices have a lot of calories, yet do not fill you up.  Eat nutritious foods and avoid empty calories. Empty calories are calories you get from foods or beverages that do not have many vitamins or protein, such as candy, sweets, and  soda. It is better to have a nutritious high-calorie food (such as an avocado) than a food with few nutrients (such as a bag of chips).  Know how many calories are in the foods you eat most often. This will help you calculate calorie counts faster.  Pay attention to calories in drinks. Low-calorie drinks include water and unsweetened drinks.  Pay attention to nutrition labels for "low fat" or "fat free" foods. These foods sometimes have the same amount of calories or more calories than the full fat versions. They also often have added sugar, starch, or salt, to make up for flavor that was removed with the fat.  Find a way of tracking calories that works for you. Get creative. Try different apps or programs if writing down calories does not work for you. What are some portion control tips?  Know how many calories are in a serving. This will help you know how many servings of a certain food you can have.  Use a measuring cup to measure serving sizes. You could also try weighing out portions on a kitchen scale. With time, you will be able to estimate serving sizes for some foods.  Take some time to put servings of different foods on your favorite plates, bowls, and cups so you know what a serving looks like.  Try not to eat straight from a bag or box. Doing this can lead to overeating. Put the amount you would like to eat in a cup or on a plate to make sure you are eating the right portion.  Use smaller plates, glasses, and bowls to prevent overeating.  Try not to multitask (for example, watch TV or use your computer) while eating. If it is time to eat, sit down at a table and enjoy your food. This will help you to know when you are full. It will also help you to be aware of what you are eating and how much you are eating. What are tips for following this plan? Reading food labels  Check the calorie count compared to the serving size. The serving size may be smaller than what you are used to  eating.  Check the source of the calories. Make sure the food you are eating is high in vitamins and protein and low in saturated and trans fats. Shopping  Read nutrition labels while you shop. This will help you make healthy decisions before you decide to purchase your food.  Make a grocery list and stick to it. Cooking  Try to cook your favorite foods in a healthier way. For example, try baking instead of frying.  Use low-fat dairy products. Meal planning  Use more fruits and vegetables. Half of your plate should   be fruits and vegetables.  Include lean proteins like poultry and fish. How do I count calories when eating out?  Ask for smaller portion sizes.  Consider sharing an entree and sides instead of getting your own entree.  If you get your own entree, eat only half. Ask for a box at the beginning of your meal and put the rest of your entree in it so you are not tempted to eat it.  If calories are listed on the menu, choose the lower calorie options.  Choose dishes that include vegetables, fruits, whole grains, low-fat dairy products, and lean protein.  Choose items that are boiled, broiled, grilled, or steamed. Stay away from items that are buttered, battered, fried, or served with cream sauce. Items labeled "crispy" are usually fried, unless stated otherwise.  Choose water, low-fat milk, unsweetened iced tea, or other drinks without added sugar. If you want an alcoholic beverage, choose a lower calorie option such as a glass of wine or light beer.  Ask for dressings, sauces, and syrups on the side. These are usually high in calories, so you should limit the amount you eat.  If you want a salad, choose a garden salad and ask for grilled meats. Avoid extra toppings like bacon, cheese, or fried items. Ask for the dressing on the side, or ask for olive oil and vinegar or lemon to use as dressing.  Estimate how many servings of a food you are given. For example, a serving of  cooked rice is  cup or about the size of half a baseball. Knowing serving sizes will help you be aware of how much food you are eating at restaurants. The list below tells you how big or small some common portion sizes are based on everyday objects: ? 1 oz-4 stacked dice. ? 3 oz-1 deck of cards. ? 1 tsp-1 die. ? 1 Tbsp- a ping-pong ball. ? 2 Tbsp-1 ping-pong ball. ?  cup- baseball. ? 1 cup-1 baseball. Summary  Calorie counting means keeping track of how many calories you eat and drink each day. If you eat fewer calories than your body needs, you should lose weight.  A healthy amount of weight to lose per week is usually 1-2 lb (0.5-0.9 kg). This usually means reducing your daily calorie intake by 500-750 calories.  The number of calories in a food can be found on a Nutrition Facts label. If a food does not have a Nutrition Facts label, try to look up the calories online or ask your dietitian for help.  Use your calories on foods and drinks that will fill you up, and not on foods and drinks that will leave you hungry.  Use smaller plates, glasses, and bowls to prevent overeating. This information is not intended to replace advice given to you by your health care provider. Make sure you discuss any questions you have with your health care provider. Document Released: 03/05/2005 Document Revised: 02/03/2016 Document Reviewed: 02/03/2016 Elsevier Interactive Patient Education  2018 [employer] Serving Sizes A serving size is a measured amount of food or drink, such as one slice of bread, that has an associated nutrient content. Knowing the serving size of a food or drink can help you determine how much of that food you should consume. What is the size of one serving? The size of one healthy serving depends on the food or drink. To determine a serving size, read the food label. If the food or drink does not have a food label, try  to find serving size information online. Or, use the  following to estimate the size of one adult serving: Grain 1 slice bread.  bagel.  cup pasta. Vegetable  cup cooked or canned vegetables. 1 cup raw, leafy greens. Fruit  cup canned fruit. 1 medium fruit.  cup dried fruit. Meat and Other Protein Sources 1 oz meat, poultry, or fish.  cup cooked beans. 1 egg.  cup nuts or seeds. 1 Tbsp nut butter.  cup tofu or tempeh. 2 Tbsp hummus. Dairy An individual container of yogurt (6-8 oz). 1 piece of cheese the size of your thumb (1 oz). 1 cup (8 oz) milk or milk alternative. Fat A piece the size of one dice. 1 tsp soft margarine. 1 Tbsp mayonnaise. 1 tsp vegetable oil. 1 Tbsp regular salad dressing. 2 Tbsp low-fat salad dressing. How many servings should I eat from each food group each day? The following are the suggested number of servings to try and have every day from each food group. You can also look at your eating throughout the week and aim for meeting these requirements on most days for overall healthy eating. Grain 6-8 servings. Try to have half of your grains from whole grains, such as whole wheat bread, corn tortillas, oatmeal, brown rice, whole wheat pasta, and bulgur. Vegetable At least 2-3 servings. Fruit 2 servings. Meat and Other Protein Foods 5-6 servings. Aim to have lean proteins, such as chicken, [place], fish, beans, or tofu. Dairy 3 servings. Choose low-fat or nonfat if you are trying to control your weight. Fat 2-3 servings. Is a serving the same thing as a portion? No. A portion is the actual amount you eat, which may be more than one serving. Knowing the specific serving size of a food and the nutritional information that goes with it can help you make a healthy decision on what size portion to eat. What are some tips to help me learn healthy serving sizes?  Check food labels for serving sizes. Many foods that come as a single portion actually contain multiple servings.  Determine the serving size of foods you  commonly eat and figure out how large a portion you usually eat.  Measure the number of servings that can be held by the bowls, glasses, cups, and plates you typically use. For example, pour your breakfast cereal into your regular bowl and then pour it into a measuring cup.  For 1-2 days, measure the serving sizes of all the foods you eat.  Practice estimating serving sizes and determining how big your portions should be. This information is not intended to replace advice given to you by your health care provider. Make sure you discuss any questions you have with your health care provider. Document Released: 12/02/2002 Document Revised: 10/29/2015 Document Reviewed: 06/02/2013 Elsevier Interactive Patient Education  [employer].

## 2017-04-18 NOTE — Progress Notes (Signed)
Patient ID: Julia MenghiniVictoria R [name], female   DOB: 1985/08/23, 32 y.o.   MRN: [id number] History of Present Illness: Julia [name] is a 32 year old black female in for well woman gyn exam, she had normal pap 10/31/15. She works 12 hours and feels like she eats more at work than home.  No PCP.   Current Medications, Allergies, Past Medical History, Past Surgical History, Family History and Social History were reviewed in [facility] electronic medical record.     Review of Systems:  Patient denies any headaches, hearing loss, fatigue, blurred vision, shortness of breath, chest pain, abdominal pain, problems with bowel movements, urination, or intercourse. No joint pain or mood swings. +weight gain of 36 lbs since getting IUD 03/30/13.   Physical Exam:BP 116/64 (BP Location: Right Arm, Patient Position: Sitting, Cuff Size: Large)   Pulse 89   Resp 16   Ht 5' 4.5" (1.638 m)   Wt 210 lb (95.3 kg)   BMI 35.49 kg/m  General:  Well developed, well nourished, no acute distress Skin:  Warm and dry Neck:  Midline trachea, normal thyroid, good ROM, no lymphadenopathy Lungs; Clear to auscultation bilaterally Breast:  No dominant palpable mass, retraction, or nipple discharge Cardiovascular: Regular rate and rhythm Abdomen:  Soft, non tender, no hepatosplenomegaly Pelvic:  External genitalia is normal in appearance, no lesions.  The vagina is normal in appearance. Urethra has no lesions or masses. The cervix is bulbous.+IUD strings.  Uterus is felt to be normal size, shape, and contour.  No adnexal masses or tenderness noted.Bladder is non tender, no masses felt.GC/CHL obtained.  Extremities/musculoskeletal:  No swelling or varicosities noted, no clubbing or cyanosis Psych:  No mood changes, alert and cooperative,seems happy PHQ 2 score 0.  Impression:  1. Well woman exam with routine gynecological exam   2. Family planning   3. IUD (intrauterine device) in place   4. Screening examination for STD  (sexually transmitted disease)   5. Weight gain   6. Personal history of gestational diabetes   7. Screening for diabetes mellitus   8. Essential hypertension      Plan: Check CMP,TSH and A1c and HIV,RPR GC/CHL sent Pap and physical in 1 year Review handouts on weight loss

## 2017-04-19 LAB — COMPREHENSIVE METABOLIC PANEL
ALBUMIN: 4 g/dL (ref 3.5–5.5)
ALT: 25 IU/L (ref 0–32)
AST: 14 IU/L (ref 0–40)
Albumin/Globulin Ratio: 1.1 — ABNORMAL LOW (ref 1.2–2.2)
Alkaline Phosphatase: 36 IU/L — ABNORMAL LOW (ref 39–117)
BUN / CREAT RATIO: 12 (ref 9–23)
BUN: 9 mg/dL (ref 6–20)
Bilirubin Total: 0.4 mg/dL (ref 0.0–1.2)
CO2: 26 mmol/L (ref 20–29)
CREATININE: 0.73 mg/dL (ref 0.57–1.00)
Calcium: 9.5 mg/dL (ref 8.7–10.2)
Chloride: 101 mmol/L (ref 96–106)
GFR calc non Af Amer: 111 mL/min/{1.73_m2} (ref >59)
GFR, EST AFRICAN AMERICAN: 128 mL/min/{1.73_m2} (ref >59)
Globulin, Total: 3.5 g/dL (ref 1.5–4.5)
Glucose: 102 mg/dL — ABNORMAL HIGH (ref 65–99)
Potassium: 4.7 mmol/L (ref 3.5–5.2)
Sodium: 141 mmol/L (ref 134–144)
TOTAL PROTEIN: 7.5 g/dL (ref 6.0–8.5)

## 2017-04-19 LAB — HIV ANTIBODY (ROUTINE TESTING W REFLEX): HIV Screen 4th Generation wRfx: NONREACTIVE

## 2017-04-19 LAB — TSH: TSH: 0.475 u[IU]/mL (ref 0.450–4.500)

## 2017-04-19 LAB — RPR: RPR Ser Ql: NONREACTIVE

## 2017-04-19 LAB — HEMOGLOBIN A1C
Est. average glucose Bld gHb Est-mCnc: 128 mg/dL
Hgb A1c MFr Bld: 6.1 % — ABNORMAL HIGH (ref 4.8–5.6)

## 2017-04-21 LAB — GC/CHLAMYDIA PROBE AMP
Chlamydia trachomatis, NAA: NEGATIVE
Neisseria gonorrhoeae by PCR: NEGATIVE

## 2018-06-05 ENCOUNTER — Other Ambulatory Visit: Payer: Self-pay

## 2018-06-05 ENCOUNTER — Ambulatory Visit (INDEPENDENT_AMBULATORY_CARE_PROVIDER_SITE_OTHER): Payer: Medicaid Other | Admitting: Adult Health

## 2018-06-05 ENCOUNTER — Encounter: Payer: Self-pay | Admitting: Adult Health

## 2018-06-05 ENCOUNTER — Other Ambulatory Visit (HOSPITAL_COMMUNITY)
Admission: RE | Admit: 2018-06-05 | Discharge: 2018-06-05 | Disposition: A | Discharge status: Home / Self Care | Payer: Commercial Managed Care - PPO | Source: Ambulatory Visit | Attending: Adult Health | Admitting: Adult Health

## 2018-06-05 VITALS — BP 132/89 | HR 75 | Ht 64.0 in | Wt 208.0 lb

## 2018-06-05 DIAGNOSIS — Z01419 Encounter for gynecological examination (general) (routine) without abnormal findings: Secondary | ICD-10-CM | POA: Insufficient documentation

## 2018-06-05 DIAGNOSIS — Z0000 Encounter for general adult medical examination without abnormal findings: Secondary | ICD-10-CM | POA: Diagnosis not present

## 2018-06-05 DIAGNOSIS — Z3009 Encounter for other general counseling and advice on contraception: Secondary | ICD-10-CM

## 2018-06-05 DIAGNOSIS — I10 Essential (primary) hypertension: Secondary | ICD-10-CM

## 2018-06-05 DIAGNOSIS — Z113 Encounter for screening for infections with a predominantly sexual mode of transmission: Secondary | ICD-10-CM | POA: Insufficient documentation

## 2018-06-05 DIAGNOSIS — Z975 Presence of (intrauterine) contraceptive device: Secondary | ICD-10-CM

## 2018-06-05 MED ORDER — LISINOPRIL 10 MG PO TABS: 10.0000 mg | ORAL_TABLET | Freq: Every day | ORAL | 4 refills | Status: DC

## 2018-06-05 MED ORDER — HYDROCHLOROTHIAZIDE 12.5 MG PO CAPS: 12.5000 mg | ORAL_CAPSULE | Freq: Every day | ORAL | 4 refills | Status: DC

## 2018-06-05 NOTE — Progress Notes (Signed)
Patient ID: Julia [name], female   DOB: [birth year], 33 y.o.   MRN: [id number] History of Present Illness: Julia [name] is a 33 year old black female,single, G5P3, in for a well woman gyn and pap. She has IUD that was placed 03/30/2013. She has been out of BP meds for about 2 weeks.  She has family planning medicaid.  PCP is [facility].    Current Medications, Allergies, Past Medical History, Past Surgical History, Family History and Social History were reviewed in [facility] record.     Review of Systems: Patient denies any headaches, hearing loss, fatigue, blurred vision, shortness of breath, chest pain, abdominal pain, problems with bowel movements, urination, or intercourse.(no sex in 2 years) No joint pain or mood swings. No period with IUD.   Physical Exam:BP 132/89 (BP Location: Left Arm, Patient Position: Sitting, Cuff Size: Normal)   Pulse 75   Ht 5\' 4"  (1.626 m)   Wt 208 lb (94.3 kg)   BMI 35.70 kg/m  General:  Well developed, well nourished, no acute distress Skin:  Warm and dry Neck:  Midline trachea, normal thyroid, good ROM, no lymphadenopathy Lungs; Clear to auscultation bilaterally Breast:  No dominant palpable mass, retraction, or nipple discharge Cardiovascular: Regular rate and rhythm Abdomen:  Soft, non tender, no hepatosplenomegaly Pelvic:  External genitalia is normal in appearance, no lesions.  The vagina is normal in appearance. Urethra has no lesions or masses. The cervix is bulbous.Pap with GC/CHL and HPV performed, Has short IUD strings.  Uterus is felt to be normal size, shape, and contour.  No adnexal masses or tenderness noted.Bladder is non tender, no masses felt. Extremities/musculoskeletal:  No swelling or varicosities noted, no clubbing or cyanosis Psych:  No mood changes, alert and cooperative,seems happy Fall risk is low. PHQ 2 score 0.  Examination chaperoned by Marchelle Folks Rash LPN. She wants another IUD.  Get back on BP meds.    Impression: 1. Well woman exam with routine gynecological exam   2. Family planning   3. Screening examination for STD (sexually transmitted disease)   4. IUD (intrauterine device) in place   5. Essential hypertension       Plan: Check HIV and RPR No sex Return 06/17/2018 for IUD removal and insertion with Selena Batten Physical in 1 year  Pap in 3 if normal  Meds ordered this encounter  Medications  . hydrochlorothiazide (MICROZIDE) 12.5 MG capsule    Sig: Take 1 capsule (12.5 mg total) by mouth daily.    Dispense:  90 capsule    Refill:  4    Order Specific Question:   Supervising Provider    Answer:   Despina Hidden, LUTHER H [[id number]]  . lisinopril (PRINIVIL,ZESTRIL) 10 MG tablet    Sig: Take 1 tablet (10 mg total) by mouth daily.    Dispense:  90 tablet    Refill:  4    Order Specific Question:   Supervising Provider    Answer:   Lazaro Arms [[id number]]  Continue working out

## 2018-06-06 LAB — HIV ANTIBODY (ROUTINE TESTING W REFLEX): HIV Screen 4th Generation wRfx: NONREACTIVE

## 2018-06-06 LAB — RPR: RPR Ser Ql: NONREACTIVE

## 2018-06-10 ENCOUNTER — Encounter: Payer: Self-pay | Admitting: Adult Health

## 2018-06-10 DIAGNOSIS — R87610 Atypical squamous cells of undetermined significance on cytologic smear of cervix (ASC-US): Secondary | ICD-10-CM

## 2018-06-10 HISTORY — DX: Atypical squamous cells of undetermined significance on cytologic smear of cervix (ASC-US): R87.610

## 2018-06-10 LAB — CYTOLOGY - PAP
CHLAMYDIA, DNA PROBE: NEGATIVE
Diagnosis: UNDETERMINED — AB
HPV: NOT DETECTED
NEISSERIA GONORRHEA: NEGATIVE

## 2018-06-16 ENCOUNTER — Telehealth: Payer: Self-pay | Admitting: *Deleted

## 2018-06-16 NOTE — Telephone Encounter (Signed)
LMOVM that no visitors or children are allowed to come to appt with her.  Advised to call office first if has come in contact with anyone who in the last month has been confirmed or suspected of having covid-19. Pt denies fever, cough, SOB, muscle pain, diarrhea, rash, vomiting, abdominal pain, red eye, weakness, bruising, bleeding, joint pain or severe headache.

## 2018-06-17 ENCOUNTER — Other Ambulatory Visit: Payer: Self-pay

## 2018-06-17 ENCOUNTER — Encounter: Payer: Self-pay | Admitting: Women's Health

## 2018-06-17 ENCOUNTER — Ambulatory Visit (INDEPENDENT_AMBULATORY_CARE_PROVIDER_SITE_OTHER): Payer: Commercial Managed Care - PPO | Admitting: Women's Health

## 2018-06-17 VITALS — BP 147/100 | HR 88 | Temp 98.8°F | Ht 64.0 in | Wt 202.0 lb

## 2018-06-17 DIAGNOSIS — Z3202 Encounter for pregnancy test, result negative: Secondary | ICD-10-CM

## 2018-06-17 DIAGNOSIS — Z30432 Encounter for removal of intrauterine contraceptive device: Secondary | ICD-10-CM

## 2018-06-17 DIAGNOSIS — Z30430 Encounter for insertion of intrauterine contraceptive device: Secondary | ICD-10-CM

## 2018-06-17 LAB — POCT URINE PREGNANCY: PREG TEST UR: NEGATIVE

## 2018-06-17 MED ORDER — LEVONORGESTREL 19.5 MCG/DAY IU IUD
INTRAUTERINE_SYSTEM | Freq: Once | INTRAUTERINE | Status: AC
  Administered 2018-06-17: 16:00:00 via INTRAUTERINE

## 2018-06-17 NOTE — Patient Instructions (Signed)
 Nothing in vagina for 3 days (no sex, douching, tampons, etc...)  Check your strings once a month to make sure you can feel them, if you are not able to please let us know  If you develop a fever of 100.4 or more in the next few weeks, or if you develop severe abdominal pain, please let us know  Use a backup method of birth control, such as condoms, for 2 weeks    Intrauterine Device Insertion, Care After  This sheet gives you information about how to care for yourself after your procedure. Your health care provider may also give you more specific instructions. If you have problems or questions, contact your health care provider. What can I expect after the procedure? After the procedure, it is common to have:  Cramps and pain in the abdomen.  Light bleeding (spotting) or heavier bleeding that is like your menstrual period. This may last for up to a few days.  Lower back pain.  Dizziness.  Headaches.  Nausea. Follow these instructions at home:  Before resuming sexual activity, check to make sure that you can feel the IUD string(s). You should be able to feel the end of the string(s) below the opening of your cervix. If your IUD string is in place, you may resume sexual activity. ? If you had a hormonal IUD inserted more than 7 days after your most recent period started, you will need to use a backup method of birth control for 7 days after IUD insertion. Ask your health care provider whether this applies to you.  Continue to check that the IUD is still in place by feeling for the string(s) after every menstrual period, or once a month.  Take over-the-counter and prescription medicines only as told by your health care provider.  Do not drive or use heavy machinery while taking prescription pain medicine.  Keep all follow-up visits as told by your health care provider. This is important. Contact a health care provider if:  You have bleeding that is heavier or lasts longer than  a normal menstrual cycle.  You have a fever.  You have cramps or abdominal pain that get worse or do not get better with medicine.  You develop abdominal pain that is new or is not in the same area of earlier cramping and pain.  You feel lightheaded or weak.  You have abnormal or bad-smelling discharge from your vagina.  You have pain during sexual activity.  You have any of the following problems with your IUD string(s): ? The string bothers or hurts you or your sexual partner. ? You cannot feel the string. ? The string has gotten longer.  You can feel the IUD in your vagina.  You think you may be pregnant, or you miss your menstrual period.  You think you may have an STI (sexually transmitted infection). Get help right away if:  You have flu-like symptoms.  You have a fever and chills.  You can feel that your IUD has slipped out of place. Summary  After the procedure, it is common to have cramps and pain in the abdomen. It is also common to have light bleeding (spotting) or heavier bleeding that is like your menstrual period.  Continue to check that the IUD is still in place by feeling for the string(s) after every menstrual period, or once a month.  Keep all follow-up visits as told by your health care provider. This is important.  Contact your health care provider if   you have problems with your IUD string(s), such as the string getting longer or bothering you or your sexual partner. This information is not intended to replace advice given to you by your health care provider. Make sure you discuss any questions you have with your health care provider. Document Released: 11/01/2010 Document Revised: 01/25/2016 Document Reviewed: 01/25/2016 Elsevier Interactive Patient Education  2019 Elsevier Inc.  Levonorgestrel intrauterine device (IUD) What is this medicine? LEVONORGESTREL IUD (LEE voe nor jes trel) is a contraceptive (birth control) device. The device is placed  inside the uterus by a healthcare professional. It is used to prevent pregnancy. This device can also be used to treat heavy bleeding that occurs during your period. This medicine may be used for other purposes; ask your health care provider or pharmacist if you have questions. COMMON BRAND NAME(S): Kyleena, LILETTA, Mirena, Skyla What should I tell my health care provider before I take this medicine? They need to know if you have any of these conditions: -abnormal Pap smear -cancer of the breast, uterus, or cervix -diabetes -endometritis -genital or pelvic infection now or in the past -have more than one sexual partner or your partner has more than one partner -heart disease -history of an ectopic or tubal pregnancy -immune system problems -IUD in place -liver disease or tumor -problems with blood clots or take blood-thinners -seizures -use intravenous drugs -uterus of unusual shape -vaginal bleeding that has not been explained -an unusual or allergic reaction to levonorgestrel, other hormones, silicone, or polyethylene, medicines, foods, dyes, or preservatives -pregnant or trying to get pregnant -breast-feeding How should I use this medicine? This device is placed inside the uterus by a health care professional. Talk to your pediatrician regarding the use of this medicine in children. Special care may be needed. Overdosage: If you think you have taken too much of this medicine contact a poison control center or emergency room at once. NOTE: This medicine is only for you. Do not share this medicine with others. What if I miss a dose? This does not apply. Depending on the brand of device you have inserted, the device will need to be replaced every 3 to 5 years if you wish to continue using this type of birth control. What may interact with this medicine? Do not take this medicine with any of the following medications: -amprenavir -bosentan -fosamprenavir This medicine may also  interact with the following medications: -aprepitant -armodafinil -barbiturate medicines for inducing sleep or treating seizures -bexarotene -boceprevir -griseofulvin -medicines to treat seizures like carbamazepine, ethotoin, felbamate, oxcarbazepine, phenytoin, topiramate -modafinil -pioglitazone -rifabutin -rifampin -rifapentine -some medicines to treat HIV infection like atazanavir, efavirenz, indinavir, lopinavir, nelfinavir, tipranavir, ritonavir -St. John's wort -warfarin This list may not describe all possible interactions. Give your health care provider a list of all the medicines, herbs, non-prescription drugs, or dietary supplements you use. Also tell them if you smoke, drink alcohol, or use illegal drugs. Some items may interact with your medicine. What should I watch for while using this medicine? Visit your doctor or health care professional for regular check ups. See your doctor if you or your partner has sexual contact with others, becomes HIV positive, or gets a sexual transmitted disease. This product does not protect you against HIV infection (AIDS) or other sexually transmitted diseases. You can check the placement of the IUD yourself by reaching up to the top of your vagina with clean fingers to feel the threads. Do not pull on the threads. It is a good habit   to check placement after each menstrual period. Call your doctor right away if you feel more of the IUD than just the threads or if you cannot feel the threads at all. The IUD may come out by itself. You may become pregnant if the device comes out. If you notice that the IUD has come out use a backup birth control method like condoms and call your health care provider. Using tampons will not change the position of the IUD and are okay to use during your period. This IUD can be safely scanned with magnetic resonance imaging (MRI) only under specific conditions. Before you have an MRI, tell your healthcare provider that  you have an IUD in place, and which type of IUD you have in place. What side effects may I notice from receiving this medicine? Side effects that you should report to your doctor or health care professional as soon as possible: -allergic reactions like skin rash, itching or hives, swelling of the face, lips, or tongue -fever, flu-like symptoms -genital sores -high blood pressure -no menstrual period for 6 weeks during use -pain, swelling, warmth in the leg -pelvic pain or tenderness -severe or sudden headache -signs of pregnancy -stomach cramping -sudden shortness of breath -trouble with balance, talking, or walking -unusual vaginal bleeding, discharge -yellowing of the eyes or skin Side effects that usually do not require medical attention (report to your doctor or health care professional if they continue or are bothersome): -acne -breast pain -change in sex drive or performance -changes in weight -cramping, dizziness, or faintness while the device is being inserted -headache -irregular menstrual bleeding within first 3 to 6 months of use -nausea This list may not describe all possible side effects. Call your doctor for medical advice about side effects. You may report side effects to FDA at 1-800-FDA-1088. Where should I keep my medicine? This does not apply. NOTE: This sheet is a summary. It may not cover all possible information. If you have questions about this medicine, talk to your doctor, pharmacist, or health care provider.  2019 Elsevier/Gold Standard (2015-12-16 14:14:56)  

## 2018-06-17 NOTE — Addendum Note (Signed)
Addended by: Colen Darling on: 06/17/2018 03:55 PM   Modules accepted: Orders

## 2018-06-17 NOTE — Progress Notes (Signed)
   IUD REMOVAL & RE-INSERTION Patient name: Julia [name] MRN [id number]  Date of birth: @DOB  Subjective Findings:   @Dae  R Manly is a 33 y.o. [id number] African American female being seen today for removal of a Mirena IUD and insertion of a Liletta IUD. Her IUD was placed Jan 2015. CHTN- forgot to take meds this am.   No LMP recorded. (Menstrual status: IUD). Last pap3/19/20. Results were:  ASCUS w/ -HRHPV  The risks and benefits of the method and placement have been thouroughly reviewed with the patient and all questions were answered.  Specifically the patient is aware of failure rate of 03/998, expulsion of the IUD and of possible perforation.  The patient is aware of irregular bleeding due to the method and understands the incidence of irregular bleeding diminishes with time.  Signed copy of informed consent in chart.  Pertinent History Reviewed:   Reviewed past medical,surgical, social, obstetrical and family history.  Reviewed problem list, medications and allergies. Objective Findings & Procedure:    Vitals:   06/17/18 1414 06/17/18 1427  BP: (!) 165/97 (!) 147/100  Pulse: (!) 114 88  Temp: 98.8 F (37.1 C)   Weight: 202 lb (91.6 kg)   Height: 5\' 4"  (1.626 m)   Body mass index is 34.67 kg/m.  Results for orders placed or performed in visit on 06/17/18 (from the past 24 hour(s))  POCT urine pregnancy   Collection Time: 06/17/18  2:18 PM  Result Value Ref Range   Preg Test, Ur Negative Negative     Time out was performed.  A graves speculum was placed in the vagina.  The cervix was visualized, prepped using Betadine. The strings were visible. They were grasped and the Mirena IUD was easily removed. The cervix was then grasped with a single-tooth tenaculum. The uterus was found to be anteroflexed and it sounded to 8 cm.  Liletta IUD placed per manufacturer's recommendations without complications. The strings were trimmed to approximately 3 cm.  The patient tolerated the  procedure well.   Informal transvaginal sonogram was performed and the proper placement of the IUD was verified.  Assessment & Plan:   1) Mirena IUD removal & Liletta insertion The patient was given post procedure instructions, including signs and symptoms of infection and to check for the strings after each menses or each month, and refraining from intercourse or anything in the vagina for 3 days. She was given a Liletta care card with date IUD placed, and date IUD to be removed. She is scheduled for a f/u appointment in 4 weeks.  Orders Placed This Encounter  Procedures  . POCT urine pregnancy    Follow-up: Return in about 4 weeks (around 07/15/2018) for tele visit. d/t coronavirus  Cheral Marker CNM, [facility] 06/17/2018 2:57 PM

## 2018-07-14 ENCOUNTER — Encounter: Payer: Self-pay | Admitting: *Deleted

## 2018-07-15 ENCOUNTER — Other Ambulatory Visit: Payer: Self-pay

## 2018-07-15 ENCOUNTER — Ambulatory Visit (INDEPENDENT_AMBULATORY_CARE_PROVIDER_SITE_OTHER): Payer: Commercial Managed Care - PPO | Admitting: Women's Health

## 2018-07-15 ENCOUNTER — Encounter: Payer: Self-pay | Admitting: Women's Health

## 2018-07-15 VITALS — Ht 64.0 in | Wt 194.4 lb

## 2018-07-15 DIAGNOSIS — Z30431 Encounter for routine checking of intrauterine contraceptive device: Secondary | ICD-10-CM

## 2018-07-15 NOTE — Progress Notes (Signed)
   TELEHEALTH VIRTUAL GYN VISIT ENCOUNTER NOTE Patient name: Julia [name] MRN [id number]  Date of birth: 1985/07/07  I connected with patient on 07/15/18 at  1:45 PM EDT by telephone  and verified that I am speaking with the correct person using two identifiers.  Due to COVID-19 recommendations, pt is not currently in the office.    I discussed the limitations, risks, security and privacy concerns of performing an evaluation and management service by telephone and the availability of in person appointments. I also discussed with the patient that there may be a patient responsible charge related to this service. The patient expressed understanding and agreed to proceed.   Chief Complaint:   iud check  History of Present Illness:   Julia [name] is a 33 y.o. [id number] African American female being evaluated today for IUD f/u.   Had Mirena removal & Liletta insertion 06/17/18. No problems, pain, bleeding, etc. Hasn't tried to check strings. Partner mentioned he felt them during intercourse. CHTN, bp up at IUD insertion visit, hadn't taken meds that am. Took bp yesterday, 120s/80s.   No LMP recorded. (Menstrual status: IUD). The current method of family planning is IUD. Last pap 06/05/18. Results were:  ASCUS w/ -HRHPV Review of Systems:   Pertinent items are noted in HPI Denies fever/chills, dizziness, headaches, visual disturbances, fatigue, shortness of breath, chest pain, abdominal pain, vomiting, abnormal vaginal discharge/itching/odor/irritation, problems with periods, bowel movements, urination, or intercourse unless otherwise stated above.  Pertinent History Reviewed:  Reviewed past medical,surgical, social, obstetrical and family history.  Reviewed problem list, medications and allergies. Physical Assessment:   Vitals:   07/15/18 1347  Weight: 194 lb 6.4 oz (88.2 kg)  Height: 5\' 4"  (1.626 m)  Body mass index is 33.37 kg/m.       Physical Examination:   General:  Alert, oriented  and cooperative.   Mental Status: Normal mood and affect perceived. Normal judgment and thought content.  Physical exam deferred due to nature of the encounter  No results found for this or any previous visit (from the past 24 hour(s)).  Assessment & Plan:  1) IUD check> doing well, check strings monthly  Meds: No orders of the defined types were placed in this encounter.   No orders of the defined types were placed in this encounter.   I discussed the assessment and treatment plan with the patient. The patient was provided an opportunity to ask questions and all were answered. The patient agreed with the plan and demonstrated an understanding of the instructions.   The patient was advised to call back or seek an in-person evaluation/go to the ED if the symptoms worsen or if the condition fails to improve as anticipated.  I provided 5 minutes of non-face-to-face time during this encounter.   Return for after 06/05/19 for , Pap & physical.  Cheral Marker CNM, [facility] 07/15/2018 1:58 PM

## 2018-10-31 ENCOUNTER — Telehealth: Payer: Self-pay | Admitting: Obstetrics & Gynecology

## 2018-10-31 NOTE — Telephone Encounter (Signed)
Patient called, stated that she needs a note for the [facility] stating that there is a need for her to workout due to high blood pressure.  They require this for her to workout.  [phone]

## 2018-11-03 ENCOUNTER — Encounter: Payer: Self-pay | Admitting: Obstetrics & Gynecology

## 2018-11-03 NOTE — Telephone Encounter (Signed)
The letter has been done and is available for her thru my chart or you guys can make a copy for her and she can pick it up

## 2018-12-06 ENCOUNTER — Other Ambulatory Visit: Payer: Self-pay | Admitting: Adult Health

## 2019-01-13 ENCOUNTER — Telehealth: Payer: Self-pay | Admitting: *Deleted

## 2019-01-13 ENCOUNTER — Other Ambulatory Visit: Payer: Self-pay | Admitting: Women's Health

## 2019-01-13 MED ORDER — WRIST SPLINT MISC: 0 refills | Status: DC

## 2019-01-13 NOTE — Telephone Encounter (Signed)
Left message letting pt know wrist braces were sent to pharmacy. [place]

## 2019-01-13 NOTE — Telephone Encounter (Signed)
Pt left message that she had carpal tunnel with her twins during pregnancy and we had sent in wrist braces for her.(twins born 02/15/2013) She wanted to see if we can send in new ones to pharmacy for her.

## 2019-01-15 ENCOUNTER — Telehealth: Payer: Self-pay | Admitting: *Deleted

## 2019-01-15 NOTE — Telephone Encounter (Signed)
Pt wants to know which pharmacy her wrist braces were sent to.

## 2019-01-16 NOTE — Telephone Encounter (Signed)
LMOVM that wrist splints were sent to [facility] in [place]. Advised to call if she has any problems or issues.

## 2019-01-20 NOTE — Telephone Encounter (Signed)
Opened in error

## 2019-01-27 ENCOUNTER — Other Ambulatory Visit: Payer: Self-pay | Admitting: *Deleted

## 2019-01-27 MED ORDER — WRIST SPLINT MISC: 0 refills | Status: DC

## 2019-09-03 ENCOUNTER — Other Ambulatory Visit: Payer: Self-pay | Admitting: Adult Health

## 2019-11-03 ENCOUNTER — Ambulatory Visit: Payer: Commercial Managed Care - PPO | Admitting: Adult Health

## 2019-11-05 ENCOUNTER — Ambulatory Visit: Payer: Commercial Managed Care - PPO | Admitting: Adult Health

## 2020-11-28 ENCOUNTER — Other Ambulatory Visit: Payer: Self-pay | Admitting: Adult Health

## 2021-02-25 ENCOUNTER — Other Ambulatory Visit: Payer: Self-pay | Admitting: Adult Health

## 2021-06-27 ENCOUNTER — Encounter (HOSPITAL_COMMUNITY): Payer: Self-pay

## 2021-06-27 ENCOUNTER — Emergency Department (HOSPITAL_COMMUNITY)
Admission: EM | Admit: 2021-06-27 | Discharge: 2021-06-28 | Disposition: A | Discharge status: Home / Self Care | Payer: Commercial Managed Care - PPO | Attending: Physician Assistant | Admitting: Physician Assistant

## 2021-06-27 ENCOUNTER — Other Ambulatory Visit: Payer: Self-pay | Admitting: Adult Health

## 2021-06-27 ENCOUNTER — Other Ambulatory Visit: Payer: Self-pay

## 2021-06-27 DIAGNOSIS — Z5321 Procedure and treatment not carried out due to patient leaving prior to being seen by health care provider: Secondary | ICD-10-CM | POA: Insufficient documentation

## 2021-06-27 DIAGNOSIS — N644 Mastodynia: Secondary | ICD-10-CM | POA: Insufficient documentation

## 2021-06-27 LAB — BASIC METABOLIC PANEL
Anion gap: 7 (ref 5–15)
BUN: 6 mg/dL (ref 6–20)
CO2: 24 mmol/L (ref 22–32)
Calcium: 9.6 mg/dL (ref 8.9–10.3)
Chloride: 106 mmol/L (ref 98–111)
Creatinine, Ser: 0.76 mg/dL (ref 0.44–1.00)
GFR, Estimated: >60 mL/min (ref >60)
Glucose, Bld: 90 mg/dL (ref 70–99)
Potassium: 3.9 mmol/L (ref 3.5–5.1)
Sodium: 137 mmol/L (ref 135–145)

## 2021-06-27 LAB — CBC WITH DIFFERENTIAL/PLATELET
Abs Immature Granulocytes: 0.04 10*3/uL (ref 0.00–0.07)
Basophils Absolute: 0.1 10*3/uL (ref 0.0–0.1)
Basophils Relative: 1 %
Eosinophils Absolute: 0.4 10*3/uL (ref 0.0–0.5)
Eosinophils Relative: 4 %
HCT: 41.8 % (ref 36.0–46.0)
Hemoglobin: 13.2 g/dL (ref 12.0–15.0)
Immature Granulocytes: 0 %
Lymphocytes Relative: 29 %
Lymphs Abs: 3.2 10*3/uL (ref 0.7–4.0)
MCH: 30.3 pg (ref 26.0–34.0)
MCHC: 31.6 g/dL (ref 30.0–36.0)
MCV: 95.9 fL (ref 80.0–100.0)
Monocytes Absolute: 0.9 10*3/uL (ref 0.1–1.0)
Monocytes Relative: 8 %
Neutro Abs: 6.7 10*3/uL (ref 1.7–7.7)
Neutrophils Relative %: 58 %
Platelets: 333 10*3/uL (ref 150–400)
RBC: 4.36 MIL/uL (ref 3.87–5.11)
RDW: 13 % (ref 11.5–15.5)
WBC: 11.3 10*3/uL — ABNORMAL HIGH (ref 4.0–10.5)
nRBC: 0 % (ref 0.0–0.2)

## 2021-06-27 LAB — I-STAT BETA HCG BLOOD, ED (MC, WL, AP ONLY): I-stat hCG, quantitative: <5 m[IU]/mL (ref <5)

## 2021-06-27 NOTE — ED Provider Triage Note (Signed)
Emergency Medicine Provider Triage Evaluation Note ? ?Julia [name] , a 36 y.o. female  was evaluated in triage.  Pt complains of left breast pain.  She reports her left breast has been hurting for about a month.  Today she noticed swelling in the breast.  She has had abnormal discharge from the nipple.  She hasn't breast fed in 8 years. ? ?She feels fine overall ? ?Physical Exam  ?BP 129/80 (BP Location: Right Arm)   Pulse 82   Temp 99.8 ?F (37.7 ?C)   Resp 16   Ht 5\' 6"  (1.676 m)   Wt 102.1 kg   LMP  (LMP Unknown)   SpO2 99%   BMI 36.32 kg/m?  ?Gen:   Awake, no distress   ?Resp:  Normal effort  ?MSK:   Moves extremities without difficulty  ?Other:  Breast exam deferred in triage ? ?Medical Decision Making  ?Medically screening exam initiated at 8:01 PM.  Appropriate orders placed.  Julia [name] was informed that the remainder of the evaluation will be completed by another provider, this initial triage assessment does not replace that evaluation, and the importance of remaining in the ED until their evaluation is complete. ? ? ?  ?Lorin Glass, [place] ?06/27/21 2004 ? ?

## 2021-06-27 NOTE — ED Notes (Signed)
X1 no response 

## 2021-06-27 NOTE — ED Notes (Signed)
X2 no response °

## 2021-06-27 NOTE — ED Triage Notes (Signed)
Pt had left breast tenderness `53month ago. With pressure there is discharge from nipple. Denies redness or swelling to breast. Pt did have cysts removed from breast when she was younger.pt can palpate "something on lower part of left breast" ?

## 2021-07-04 ENCOUNTER — Ambulatory Visit (INDEPENDENT_AMBULATORY_CARE_PROVIDER_SITE_OTHER): Payer: Self-pay | Admitting: Advanced Practice Midwife

## 2021-07-04 ENCOUNTER — Encounter: Payer: Self-pay | Admitting: Advanced Practice Midwife

## 2021-07-04 VITALS — BP 131/86 | HR 100 | Ht 66.0 in | Wt 222.0 lb

## 2021-07-04 DIAGNOSIS — N644 Mastodynia: Secondary | ICD-10-CM

## 2021-07-04 DIAGNOSIS — N63 Unspecified lump in unspecified breast: Secondary | ICD-10-CM | POA: Insufficient documentation

## 2021-07-04 DIAGNOSIS — N6324 Unspecified lump in the left breast, lower inner quadrant: Secondary | ICD-10-CM

## 2021-07-04 NOTE — Progress Notes (Signed)
? ?  GYN VISIT ?Patient name: Julia [name] MRN [id number]  Date of birth: 1985-08-31 ?Chief Complaint:   ?Breast Problem ? ?History of Present Illness:   ?Julia [name] is a 36 y.o. [id number] African-American female being seen today for L breast tingling/pain/milk-like discharge with squeezing x 1-2wks; has never had this before; did have benign cysts x 2 removed from L breast as a teenager; last breastfed x 7yrs ago; doesn't have cycles as has IUD in place ?    ?No LMP recorded (lmp unknown). (Menstrual status: IUD). ?The current method of family planning is IUD.  ?Last pap March 2020. Results were: ASCUS w/ HRHPV negative ? ? ?  06/05/2018  ?  2:02 PM 04/18/2017  ? 10:24 AM 03/02/2016  ?  8:56 AM  ?Depression screen PHQ 2/9  ?Decreased Interest 0 0 0  ?Down, Depressed, Hopeless 0 0 0  ?PHQ - 2 Score 0 0 0  ? ?  ?   ? View : No data to display.  ?  ?  ?  ? ? ? ?Review of Systems:   ?Pertinent items are noted in HPI ?Denies fever/chills, dizziness, headaches, visual disturbances, fatigue, shortness of breath, chest pain, abdominal pain, vomiting, abnormal vaginal discharge/itching/odor/irritation, problems with periods, bowel movements, urination, or intercourse unless otherwise stated above.  ?Pertinent History Reviewed:  ?Reviewed past medical,surgical, social, obstetrical and family history.  ?Reviewed problem list, medications and allergies. ?Physical Assessment:  ? ?Vitals:  ? 07/04/21 1139  ?BP: 131/86  ?Pulse: 100  ?Weight: 222 lb (100.7 kg)  ?Height: 5\' 6"  (1.676 m)  ?Body mass index is 35.83 kg/m?. ? ?     Physical Examination:  ? General appearance: alert, well appearing, and in no distress ? Mental status: alert, oriented to person, place, and time ? Breasts: L at base of areola 7 o'clock is 2cm elongated nodule, fixed, sl tender; R normal ? Skin: warm & dry  ? Cardiovascular: normal heart rate noted ? Respiratory: normal respiratory effort, no distress ? Abdomen: soft, non-tender  ? Pelvic: examination  not indicated ? Extremities: no edema  ? ? ?No results found for this or any previous visit (from the past 24 hour(s)).  ?Assessment & Plan:  ?1) Mass on L breast with 2cm nodule> mastalgia that doesn't appear to be cyclical; will get diagnostic mammo and ultrasound at [facility] ? ? ?Meds: No orders of the defined types were placed in this encounter. ? ? ?Orders Placed This Encounter  ?Procedures  ? MM DIAG BREAST TOMO BILATERAL  ? BREAST COMPLETE UNI LEFT INC AXILLA  ? ? ?Return for Pap & Physical first avail. ? ?[place] CNM ?07/04/2021 ?12:20 PM  ?

## 2021-07-05 ENCOUNTER — Other Ambulatory Visit (HOSPITAL_COMMUNITY): Payer: Self-pay | Admitting: Advanced Practice Midwife

## 2021-07-05 DIAGNOSIS — N63 Unspecified lump in unspecified breast: Secondary | ICD-10-CM

## 2021-07-25 ENCOUNTER — Other Ambulatory Visit: Payer: Self-pay | Admitting: Adult Health

## 2021-07-25 ENCOUNTER — Ambulatory Visit (HOSPITAL_COMMUNITY): Payer: 59

## 2021-07-25 ENCOUNTER — Inpatient Hospital Stay (HOSPITAL_COMMUNITY): Admission: RE | Admit: 2021-07-25 | Payer: Medicaid Other | Source: Ambulatory Visit

## 2021-07-27 ENCOUNTER — Encounter: Payer: Self-pay | Admitting: Obstetrics & Gynecology

## 2021-07-27 ENCOUNTER — Ambulatory Visit (INDEPENDENT_AMBULATORY_CARE_PROVIDER_SITE_OTHER): Payer: 59 | Admitting: Obstetrics & Gynecology

## 2021-07-27 ENCOUNTER — Other Ambulatory Visit (HOSPITAL_COMMUNITY)
Admission: RE | Admit: 2021-07-27 | Discharge: 2021-07-27 | Disposition: A | Discharge status: Home / Self Care | Payer: 59 | Source: Ambulatory Visit | Attending: Obstetrics & Gynecology | Admitting: Obstetrics & Gynecology

## 2021-07-27 VITALS — BP 146/93 | HR 80 | Ht 66.0 in | Wt 222.4 lb

## 2021-07-27 DIAGNOSIS — Z0000 Encounter for general adult medical examination without abnormal findings: Secondary | ICD-10-CM

## 2021-07-27 DIAGNOSIS — Z3009 Encounter for other general counseling and advice on contraception: Secondary | ICD-10-CM

## 2021-07-27 DIAGNOSIS — Z113 Encounter for screening for infections with a predominantly sexual mode of transmission: Secondary | ICD-10-CM

## 2021-07-27 DIAGNOSIS — Z01419 Encounter for gynecological examination (general) (routine) without abnormal findings: Secondary | ICD-10-CM | POA: Diagnosis not present

## 2021-07-27 DIAGNOSIS — Z124 Encounter for screening for malignant neoplasm of cervix: Secondary | ICD-10-CM

## 2021-07-27 DIAGNOSIS — Z975 Presence of (intrauterine) contraceptive device: Secondary | ICD-10-CM

## 2021-07-27 DIAGNOSIS — R69 Illness, unspecified: Secondary | ICD-10-CM | POA: Diagnosis not present

## 2021-07-27 NOTE — Addendum Note (Signed)
Addended by: Octaviano Glow on: 07/27/2021 09:50 AM ? ? Modules accepted: Orders ? ?

## 2021-07-27 NOTE — Progress Notes (Signed)
? ?WELL-WOMAN EXAMINATION ?Patient name: Julia [name] MRN [id number]  Date of birth: 03-Dec-1985 ?Chief Complaint:   ?Annual Exam ? ?History of Present Illness:   ?Julia [name] is a 36 y.o. [id number]  female being seen today for a routine well-woman exam.  ?Today she notes no acute complaints or concern ? ?The current method of family planning is IUD- Liletta placed 06/17/2018.  No periods with Liletta. ? ?Seen in May due to breast concerns- issue has resolved.  Has upcoming imaging scheduled. ? ? ?No LMP recorded (lmp unknown). (Menstrual status: IUD). ? ? ?Last pap ASCUS- 05/2018.  ?Last mammogram: n/a. ?Last colonoscopy: n/a ? ? ?  07/27/2021  ?  9:04 AM 06/05/2018  ?  2:02 PM 04/18/2017  ? 10:24 AM 03/02/2016  ?  8:56 AM  ?Depression screen PHQ 2/9  ?Decreased Interest 0 0 0 0  ?Down, Depressed, Hopeless 0 0 0 0  ?PHQ - 2 Score 0 0 0 0  ?Altered sleeping 0     ?Tired, decreased energy 0     ?Change in appetite 1     ?Feeling bad or failure about yourself  0     ?Trouble concentrating 0     ?Moving slowly or fidgety/restless 0     ?Suicidal thoughts 0     ?PHQ-9 Score 1     ? ? ? ? ?Review of Systems:   ?Pertinent items are noted in HPI ?Denies any headaches, blurred vision, fatigue, shortness of breath, chest pain, abdominal pain, bowel movements, urination, or intercourse unless otherwise stated above. ? ?Pertinent History Reviewed:  ?Reviewed past medical,surgical, social and family history.  ?Reviewed problem list, medications and allergies. ?Physical Assessment:  ? ?Vitals:  ? 07/27/21 0900  ?BP: (!) 146/93  ?Pulse: 80  ?Weight: 222 lb 6.4 oz (100.9 kg)  ?Height: 5\' 6"  (1.676 m)  ?Body mass index is 35.9 kg/m?. ?  ?     Physical Examination:  ? General appearance - well appearing, and in no distress ? Mental status - alert, oriented to person, place, and time ? Psych:  She has a normal mood and affect ? Skin - warm and dry, normal color, no suspicious lesions noted ? Chest - effort normal, all lung fields  clear to auscultation bilaterally ? Heart - normal rate and regular rhythm ? Neck:  midline trachea, no thyromegaly or nodules ? Breasts - breasts appear normal, no suspicious masses, no skin or nipple changes or  axillary nodes ? Abdomen - soft, nontender, nondistended, no masses or organomegaly ? Pelvic - VULVA: normal appearing vulva with no masses, tenderness or lesions  VAGINA: normal appearing vagina with normal color and discharge, no lesions  CERVIX: normal appearing cervix without discharge or lesions, no CMT, strings visualized ? Thin prep pap is done with HR HPV cotesting ? UTERUS: uterus is felt to be normal size, shape, consistency and nontender  ? ADNEXA: No adnexal masses or tenderness noted. ? Extremities:  No swelling or varicosities noted ? ?Chaperone: Alice Rieger   ? ? ?Assessment & Plan:  ?1) Well-Woman Exam ?-pap completed, reviewed ASCCP guidelines ? ?2) IUD in place ?-strings visualized in proper location ? ? ?Orders Placed This Encounter  ?Procedures  ? RPR  ? HIV Antibody (routine testing w rflx)  ? ? ?Meds: No orders of the defined types were placed in this encounter. ? ? ?Follow-up: Return in about 1 year (around 07/28/2022) for Annual. ? ? ?Janyth Pupa, DO ?Attending Obstetrician & Gynecologist,  Faculty Practice ?[facility] ? ? ?

## 2021-07-28 LAB — HIV ANTIBODY (ROUTINE TESTING W REFLEX): HIV Screen 4th Generation wRfx: NONREACTIVE

## 2021-07-28 LAB — RPR: RPR Ser Ql: NONREACTIVE

## 2021-08-03 LAB — CYTOLOGY - PAP
Chlamydia: NEGATIVE
Comment: NEGATIVE
Comment: NEGATIVE
Comment: NORMAL
Diagnosis: UNDETERMINED — AB
High risk HPV: NEGATIVE
Neisseria Gonorrhea: NEGATIVE

## 2021-08-08 ENCOUNTER — Other Ambulatory Visit: Payer: Self-pay | Admitting: Adult Health

## 2021-08-15 ENCOUNTER — Inpatient Hospital Stay (HOSPITAL_COMMUNITY): Admission: RE | Admit: 2021-08-15 | Payer: Medicaid Other | Source: Ambulatory Visit

## 2021-08-15 ENCOUNTER — Ambulatory Visit (HOSPITAL_COMMUNITY): Admission: RE | Admit: 2021-08-15 | Payer: 59 | Source: Ambulatory Visit

## 2021-10-20 ENCOUNTER — Ambulatory Visit: Payer: 59 | Admitting: Family Medicine

## 2022-03-12 ENCOUNTER — Other Ambulatory Visit: Payer: Self-pay | Admitting: Adult Health

## 2022-08-15 ENCOUNTER — Emergency Department (HOSPITAL_COMMUNITY)
Admission: EM | Admit: 2022-08-15 | Discharge: 2022-08-15 | Disposition: A | Discharge status: Home / Self Care | Payer: Medicaid Other | Attending: Emergency Medicine | Admitting: Emergency Medicine

## 2022-08-15 ENCOUNTER — Encounter (HOSPITAL_COMMUNITY): Payer: Self-pay | Admitting: Emergency Medicine

## 2022-08-15 ENCOUNTER — Other Ambulatory Visit: Payer: Self-pay

## 2022-08-15 ENCOUNTER — Emergency Department (HOSPITAL_COMMUNITY): Payer: Medicaid Other

## 2022-08-15 DIAGNOSIS — S0990XA Unspecified injury of head, initial encounter: Secondary | ICD-10-CM | POA: Diagnosis present

## 2022-08-15 DIAGNOSIS — Y9367 Activity, basketball: Secondary | ICD-10-CM | POA: Insufficient documentation

## 2022-08-15 DIAGNOSIS — W2105XA Struck by basketball, initial encounter: Secondary | ICD-10-CM | POA: Insufficient documentation

## 2022-08-15 DIAGNOSIS — S0083XA Contusion of other part of head, initial encounter: Secondary | ICD-10-CM | POA: Diagnosis not present

## 2022-08-15 MED ORDER — ACETAMINOPHEN 325 MG PO TABS
650.0000 mg | ORAL_TABLET | Freq: Once | ORAL | Status: AC
  Administered 2022-08-15: 650 mg via ORAL
  Filled 2022-08-15: qty 2

## 2022-08-15 NOTE — Discharge Instructions (Signed)
You likely have mild concussion.  You may have an intermittent headache for several days to weeks.  Your CTs were reassuring.  Recommend that you alternate with Tylenol and or ibuprofen for your symptoms.  Avoid strenuous activity for at least 1 to 2 weeks.  Please follow-up with your primary care provider for recheck or return to the emergency department for any new or worsening symptoms.

## 2022-08-15 NOTE — ED Triage Notes (Signed)
Pt via POV stating she got hit in the face yesterday with a basketball and today feels "weird." She describes nausea, intermittent vertigo, and feeling "hazy." Pain currently rated 5/10 right anterior head and right side of nose from where her glasses pressed into her face during the incident.

## 2022-08-15 NOTE — ED Provider Notes (Signed)
[place] EMERGENCY DEPARTMENT AT [facility] Provider Note   CSN: [id number] Arrival date & time: 08/15/22  1406     History  Chief Complaint  Patient presents with   Head Injury    AVALEI BELD is a 37 y.o. female.   Head Injury Associated symptoms: headache and nausea   Associated symptoms: no neck pain and no numbness       ALAIYNA MOESSNER is a 37 y.o. female who presents to the Emergency Department complaining of headache, nausea, right facial pain and dizziness.  States that she was struck in the right side of her face and head with a basketball last evening around 7 PM.  She states since the injury she has not felt well.  States her vision is "hazy."  Nausea without vomiting.  Denies loss of consciousness or neck pain.  No numbness or weakness of her face or extremities.  She denies any dental injury or pain of her jaw.  Took Tylenol last evening and earlier this morning without relief.    Home Medications Prior to Admission medications   Medication Sig Start Date End Date Taking? Authorizing Provider  acyclovir (ZOVIRAX) 400 MG tablet TAKE 1 TABLET BY MOUTH 3 TIMES DAILY. Patient not taking: Reported on 07/04/2021 12/08/18   Cheral Marker, CNM  lisinopril (ZESTRIL) 10 MG tablet TAKE 1 TABLET BY MOUTH EVERY DAY 03/15/22   Adline Potter, NP  loratadine (CLARITIN) 10 MG tablet Take 10 mg by mouth daily.    [provider]  Multiple Vitamin (MULTIVITAMIN) tablet Take 1 tablet by mouth daily.    [provider]      Allergies    Patient has no known allergies.    Review of Systems   Review of Systems  Constitutional:  Negative for appetite change, chills and fever.  HENT:  Negative for congestion and trouble swallowing.   Eyes:  Positive for visual disturbance.  Cardiovascular:  Negative for chest pain.  Gastrointestinal:  Positive for nausea. Negative for abdominal pain.  Musculoskeletal:  Negative for arthralgias and neck  pain.  Skin:  Negative for color change and rash.  Neurological:  Positive for dizziness and headaches. Negative for syncope, facial asymmetry, weakness, light-headedness and numbness.  Psychiatric/Behavioral:  Negative for confusion.     Physical Exam Updated Vital Signs BP (!) 149/99 (BP Location: Right Wrist)   Pulse 78   Temp 98.4 F (36.9 C)   Resp 18   Ht 5\' 7"  (1.702 m)   Wt 102.1 kg   SpO2 100%   BMI 35.24 kg/m  Physical Exam Vitals and nursing note reviewed.  Constitutional:      General: She is not in acute distress.    Appearance: Normal appearance. She is not ill-appearing or toxic-appearing.  HENT:     Right Ear: Tympanic membrane and ear canal normal.     Left Ear: Tympanic membrane and ear canal normal.     Nose: No congestion.     Mouth/Throat:     Mouth: Mucous membranes are moist.  Eyes:     Extraocular Movements: Extraocular movements intact.     Conjunctiva/sclera: Conjunctivae normal.     Pupils: Pupils are equal, round, and reactive to light.  Cardiovascular:     Rate and Rhythm: Normal rate and regular rhythm.     Pulses: Normal pulses.  Pulmonary:     Effort: Pulmonary effort is normal.     Breath sounds: Normal breath sounds.  Chest:  Chest wall: No tenderness.  Abdominal:     Palpations: Abdomen is soft.     Tenderness: There is no abdominal tenderness.  Musculoskeletal:        General: Normal range of motion.     Cervical back: Normal range of motion. No tenderness.  Skin:    General: Skin is warm.     Capillary Refill: Capillary refill takes less than 2 seconds.  Neurological:     Mental Status: She is alert and oriented to person, place, and time.     GCS: GCS eye subscore is 4. GCS verbal subscore is 5. GCS motor subscore is 6.     Sensory: Sensation is intact. No sensory deficit.     Motor: Motor function is intact. No weakness.     Coordination: Coordination is intact.     Comments: CN II-XII intact.  Speech clear.  Mentating  well     ED Results / Procedures / Treatments   Labs (all labs ordered are listed, but only abnormal results are displayed) Labs Reviewed - No data to display  EKG None  Radiology CT Head Wo Contrast  Result Date: 08/15/2022 CLINICAL DATA:  Head trauma, abnormal mental status (Age 73-64y); Facial trauma, blunt hit right face with basketball, headache and facial pain EXAM: CT HEAD WITHOUT CONTRAST CT MAXILLOFACIAL WITHOUT CONTRAST TECHNIQUE: Multidetector CT imaging of the head and maxillofacial structures were performed using the standard protocol without intravenous contrast. Multiplanar CT image reconstructions of the maxillofacial structures were also generated. RADIATION DOSE REDUCTION: This exam was performed according to the departmental dose-optimization program which includes automated exposure control, adjustment of the mA and/or kV according to patient size and/or use of iterative reconstruction technique. COMPARISON:  None Available. FINDINGS: CT HEAD FINDINGS Brain: No evidence of acute infarction, hemorrhage, hydrocephalus, extra-axial collection or mass lesion/mass effect. Vascular: No hyperdense vessel or unexpected calcification. Skull: Normal. Negative for fracture or focal lesion. Other: None. CT MAXILLOFACIAL FINDINGS Osseous: No fracture or mandibular dislocation. No destructive process. Orbits: Negative. No traumatic or inflammatory finding. Sinuses: No middle ear or mastoid effusion. Paranasal sinuses are notable for mild pansinus mucosal thickening. Soft tissues: Negative. IMPRESSION: 1. No acute intracranial abnormality. 2. No acute facial bone fracture. 3. Mild pansinus mucosal thickening. Electronically Signed   By: Lorenza Cambridge M.D.   On: 08/15/2022 17:28   CT Maxillofacial Wo Contrast  Result Date: 08/15/2022 CLINICAL DATA:  Head trauma, abnormal mental status (Age 30-64y); Facial trauma, blunt hit right face with basketball, headache and facial pain EXAM: CT HEAD  WITHOUT CONTRAST CT MAXILLOFACIAL WITHOUT CONTRAST TECHNIQUE: Multidetector CT imaging of the head and maxillofacial structures were performed using the standard protocol without intravenous contrast. Multiplanar CT image reconstructions of the maxillofacial structures were also generated. RADIATION DOSE REDUCTION: This exam was performed according to the departmental dose-optimization program which includes automated exposure control, adjustment of the mA and/or kV according to patient size and/or use of iterative reconstruction technique. COMPARISON:  None Available. FINDINGS: CT HEAD FINDINGS Brain: No evidence of acute infarction, hemorrhage, hydrocephalus, extra-axial collection or mass lesion/mass effect. Vascular: No hyperdense vessel or unexpected calcification. Skull: Normal. Negative for fracture or focal lesion. Other: None. CT MAXILLOFACIAL FINDINGS Osseous: No fracture or mandibular dislocation. No destructive process. Orbits: Negative. No traumatic or inflammatory finding. Sinuses: No middle ear or mastoid effusion. Paranasal sinuses are notable for mild pansinus mucosal thickening. Soft tissues: Negative. IMPRESSION: 1. No acute intracranial abnormality. 2. No acute facial bone fracture. 3. Mild pansinus  mucosal thickening. Electronically Signed   By: Lorenza Cambridge M.D.   On: 08/15/2022 17:28     Procedures Procedures    Medications Ordered in ED Medications  acetaminophen (TYLENOL) tablet 650 mg (has no administration in time range)    ED Course/ Medical Decision Making/ A&P                             Medical Decision Making Patient here for evaluation of head injury and facial injury that occurred yesterday afternoon.  States she was struck in the face and head with a basketball.  Incidental accident.  Complains of feeling dizzy, facial pain, headache and nausea since the incident occurred.  Possible mild concussion.  Given mechanism, subdural and ICH and acute bony injury felt  less likely  Amount and/or Complexity of Data Reviewed Radiology: ordered.    Details: CT head and maxillofacial without cute findings Discussion of management or test interpretation with external provider(s): Pt with likely mild concussion.  Given head injury instructions.  Injury occurred 24 hours PTA.  Appears appropriate for d/c home.  Will f/u with PCP  Risk OTC drugs.           Final Clinical Impression(s) / ED Diagnoses Final diagnoses:  Injury of head, initial encounter  Contusion of face, initial encounter    Rx / DC Orders ED Discharge Orders     None         Pauline Aus, PA-C 08/18/22 1524    Vanetta Mulders, MD 08/18/22 2336

## 2023-01-10 ENCOUNTER — Encounter: Payer: Self-pay | Admitting: Adult Health

## 2023-01-10 ENCOUNTER — Other Ambulatory Visit: Payer: Self-pay | Admitting: Adult Health

## 2023-01-10 ENCOUNTER — Ambulatory Visit (INDEPENDENT_AMBULATORY_CARE_PROVIDER_SITE_OTHER): Payer: 59 | Admitting: Adult Health

## 2023-01-10 VITALS — BP 128/84 | HR 78 | Ht 66.0 in | Wt 225.0 lb

## 2023-01-10 DIAGNOSIS — N6452 Nipple discharge: Secondary | ICD-10-CM

## 2023-01-10 DIAGNOSIS — Z975 Presence of (intrauterine) contraceptive device: Secondary | ICD-10-CM | POA: Diagnosis not present

## 2023-01-10 DIAGNOSIS — I10 Essential (primary) hypertension: Secondary | ICD-10-CM

## 2023-01-10 DIAGNOSIS — Z01419 Encounter for gynecological examination (general) (routine) without abnormal findings: Secondary | ICD-10-CM | POA: Diagnosis not present

## 2023-01-10 MED ORDER — LISINOPRIL 10 MG PO TABS: 10.0000 mg | ORAL_TABLET | Freq: Every day | ORAL | 2 refills | Status: DC

## 2023-01-10 NOTE — Progress Notes (Signed)
Patient ID: Julia [name], female   DOB: 1985-12-19, 37 y.o.   MRN: [id number] History of Present Illness:  Julia [name] is a 37 year old black female,single, [phone] in for a well woman gyn exam, and complains of bilateral breast discharge for about 1-2 years now. She says breast will get tender and if she squeezes the nipple will have clear to milky discharge. Had TSH and Prolactin drawn before exam.     Component Value Date/Time   DIAGPAP (A) 07/27/2021 0950    - Atypical squamous cells of undetermined significance (ASC-US)   DIAGPAP (A) 06/05/2018 0000    -  ATYPICAL SQUAMOUS CELLS OF UNDETERMINED SIGNIFICANCE (ASC-US)   HPVHIGH Negative 07/27/2021 0950   ADEQPAP  07/27/2021 0950    Satisfactory for evaluation; transformation zone component PRESENT.   ADEQPAP (A) 06/05/2018 0000    Satisfactory for evaluation  endocervical/transformation zone component PRESENT.    PCP is [facility].  Current Medications, Allergies, Past Medical History, Past Surgical History, Family History and Social History were reviewed in [employer] record.     Review of Systems: Patient denies any headaches, hearing loss, fatigue, blurred vision, shortness of breath, chest pain, abdominal pain, problems with bowel movements, urination, or intercourse.(Not currently having sex). No joint pain or mood swings.  No period with IUD See HPI for positives   Physical Exam:BP 128/84 (BP Location: Left Arm, Patient Position: Sitting, Cuff Size: Normal)   Pulse 78   Ht 5\' 6"  (1.676 m)   Wt 225 lb (102.1 kg)   BMI 36.32 kg/m   General:  Well developed, well nourished, no acute distress Skin:  Warm and dry Neck:  Midline trachea, normal thyroid, good ROM, no lymphadenopathy Lungs; Clear to auscultation bilaterally Breast:  No dominant palpable mass, retraction, or nipple discharge Cardiovascular: Regular rate and rhythm Abdomen:  Soft, non tender, no hepatosplenomegaly Pelvic:  External genitalia  is normal in appearance, no lesions.  The vagina is normal in appearance. Urethra has no lesions or masses. The cervix is bulbous.+IUD strings at os.  Uterus is felt to be normal size, shape, and contour.  No adnexal masses or tenderness noted.Bladder is non tender, no masses felt. Extremities/musculoskeletal:  No swelling or varicosities noted, no clubbing or cyanosis Psych:  No mood changes, alert and cooperative,seems happy AA is 4 Fall risk is low    01/10/2023   10:13 AM 07/27/2021    9:04 AM 06/05/2018    2:02 PM  Depression screen PHQ 2/9  Decreased Interest 2 0 0  Down, Depressed, Hopeless 0 0 0  PHQ - 2 Score 2 0 0  Altered sleeping 0 0   Tired, decreased energy 1 0   Change in appetite 1 1   Feeling bad or failure about yourself  1 0   Trouble concentrating 0 0   Moving slowly or fidgety/restless 0 0   Suicidal thoughts 0 0   PHQ-9 Score 5 1    Declines any meds     01/10/2023   10:13 AM 07/27/2021    9:06 AM  GAD 7 : Generalized Anxiety Score  Nervous, Anxious, on Edge 1 0  Control/stop worrying 1 0  Worry too much - different things 1 0  Trouble relaxing 0 0  Restless 0 0  Easily annoyed or irritable 1 0  Afraid - awful might happen 1 0  Total GAD 7 Score 5 0      Upstream - 01/10/23 1004  Pregnancy Intention Screening   Does the patient want to become pregnant in the next year? No    Does the patient's partner want to become pregnant in the next year? No    Would the patient like to discuss contraceptive options today? No      Contraception Wrap Up   Current Method IUD or IUS    End Method IUD or IUS    Contraception Counseling Provided Yes             Examination chaperoned by Malachy Mood LPN   Impression and Plan: 1. Breast discharge No discharge today, but she says has had on and off for 1-2 years  Checked TSH and prolactin level before exam Will get diagnostic mammogram and [place] [facility] 01/15/23 at 8 am  - TSH - Prolactin - [place]  LIMITED ULTRASOUND INCLUDING AXILLA RIGHT BREAST; Future - [place] LIMITED ULTRASOUND INCLUDING AXILLA LEFT BREAST ; Future - MM 3D DIAGNOSTIC MAMMOGRAM BILATERAL BREAST; Future  2. Encounter for well woman exam with routine gynecological exam Physical in 1 year Pap in 2026 Has had labs will fax me a copy   3. IUD (intrauterine device) in place Liletta placed 06/17/18 No period with IUD,strings at os.   4. Essential hypertension Refilled lisinopril 10 mg 1 daily Meds ordered this encounter  Medications   lisinopril (ZESTRIL) 10 MG tablet    Sig: Take 1 tablet (10 mg total) by mouth daily.    Dispense:  90 tablet    Refill:  2    Order Specific Question:   Supervising Provider    Answer:   Lazaro Arms [[id number]]     I gave the names of Dr Hassie Bruce NP and I Polanco NP for PCP.

## 2023-01-11 LAB — TSH: TSH: 0.623 u[IU]/mL (ref 0.450–4.500)

## 2023-01-11 LAB — PROLACTIN: Prolactin: 44.8 ng/mL — ABNORMAL HIGH (ref 4.8–33.4)

## 2023-01-15 ENCOUNTER — Ambulatory Visit (HOSPITAL_COMMUNITY)
Admission: RE | Admit: 2023-01-15 | Discharge: 2023-01-15 | Disposition: A | Discharge status: Home / Self Care | Payer: 59 | Source: Ambulatory Visit | Attending: Adult Health | Admitting: Adult Health

## 2023-01-15 ENCOUNTER — Other Ambulatory Visit (HOSPITAL_COMMUNITY): Payer: Self-pay | Admitting: Adult Health

## 2023-01-15 ENCOUNTER — Encounter (HOSPITAL_COMMUNITY): Payer: Self-pay

## 2023-01-15 DIAGNOSIS — N6323 Unspecified lump in the left breast, lower outer quadrant: Secondary | ICD-10-CM | POA: Diagnosis not present

## 2023-01-15 DIAGNOSIS — N6321 Unspecified lump in the left breast, upper outer quadrant: Secondary | ICD-10-CM | POA: Diagnosis not present

## 2023-01-15 DIAGNOSIS — R928 Other abnormal and inconclusive findings on diagnostic imaging of breast: Secondary | ICD-10-CM

## 2023-01-15 DIAGNOSIS — N6452 Nipple discharge: Secondary | ICD-10-CM | POA: Insufficient documentation

## 2023-01-15 DIAGNOSIS — R92323 Mammographic fibroglandular density, bilateral breasts: Secondary | ICD-10-CM | POA: Diagnosis not present

## 2023-01-23 ENCOUNTER — Other Ambulatory Visit (HOSPITAL_COMMUNITY): Payer: Self-pay | Admitting: Adult Health

## 2023-01-23 DIAGNOSIS — R928 Other abnormal and inconclusive findings on diagnostic imaging of breast: Secondary | ICD-10-CM

## 2023-01-24 ENCOUNTER — Encounter (HOSPITAL_COMMUNITY): Payer: Self-pay

## 2023-01-24 ENCOUNTER — Ambulatory Visit (HOSPITAL_COMMUNITY)
Admission: RE | Admit: 2023-01-24 | Discharge: 2023-01-24 | Disposition: A | Discharge status: Home / Self Care | Payer: 59 | Source: Ambulatory Visit | Attending: Adult Health | Admitting: Adult Health

## 2023-01-24 DIAGNOSIS — N6022 Fibroadenosis of left breast: Secondary | ICD-10-CM | POA: Diagnosis not present

## 2023-01-24 DIAGNOSIS — N6032 Fibrosclerosis of left breast: Secondary | ICD-10-CM | POA: Diagnosis not present

## 2023-01-24 DIAGNOSIS — R928 Other abnormal and inconclusive findings on diagnostic imaging of breast: Secondary | ICD-10-CM | POA: Diagnosis not present

## 2023-01-24 DIAGNOSIS — N6323 Unspecified lump in the left breast, lower outer quadrant: Secondary | ICD-10-CM | POA: Diagnosis not present

## 2023-01-24 DIAGNOSIS — R920 Mammographic microcalcification found on diagnostic imaging of breast: Secondary | ICD-10-CM | POA: Diagnosis not present

## 2023-01-24 DIAGNOSIS — D242 Benign neoplasm of left breast: Secondary | ICD-10-CM | POA: Diagnosis not present

## 2023-01-24 DIAGNOSIS — N62 Hypertrophy of breast: Secondary | ICD-10-CM | POA: Diagnosis not present

## 2023-01-24 DIAGNOSIS — N6082 Other benign mammary dysplasias of left breast: Secondary | ICD-10-CM | POA: Diagnosis not present

## 2023-01-24 DIAGNOSIS — N6002 Solitary cyst of left breast: Secondary | ICD-10-CM | POA: Diagnosis not present

## 2023-01-24 HISTORY — PX: BREAST BIOPSY: SHX20

## 2023-01-24 MED ORDER — LIDOCAINE-EPINEPHRINE (PF) 1 %-1:200000 IJ SOLN
10.0000 mL | Freq: Once | INTRAMUSCULAR | Status: AC
  Administered 2023-01-24: 10 mL via INTRADERMAL

## 2023-01-24 MED ORDER — LIDOCAINE-EPINEPHRINE (PF) 1 %-1:200000 IJ SOLN
INTRAMUSCULAR | Status: AC
  Filled 2023-01-24: qty 30

## 2023-01-24 MED ORDER — LIDOCAINE HCL (PF) 2 % IJ SOLN
10.0000 mL | Freq: Once | INTRAMUSCULAR | Status: AC
  Administered 2023-01-24: 10 mL

## 2023-01-24 MED ORDER — LIDOCAINE HCL (PF) 2 % IJ SOLN
INTRAMUSCULAR | Status: AC
  Filled 2023-01-24: qty 10

## 2023-01-24 NOTE — Progress Notes (Signed)
PT tolerated two left breast biopsies well today with NAD noted. PT verbalized understanding of discharge instructions. PT ambulated back to the mammogram area this time and given ice packs to use at home.

## 2023-01-25 LAB — SURGICAL PATHOLOGY

## 2023-02-19 ENCOUNTER — Other Ambulatory Visit (HOSPITAL_COMMUNITY): Payer: Self-pay | Admitting: Surgery

## 2023-02-19 ENCOUNTER — Ambulatory Visit: Payer: 59 | Admitting: Surgery

## 2023-02-19 ENCOUNTER — Encounter: Payer: Self-pay | Admitting: Surgery

## 2023-02-19 VITALS — BP 118/79 | HR 71 | Temp 98.3°F | Resp 12 | Ht 66.0 in | Wt 227.0 lb

## 2023-02-19 DIAGNOSIS — D242 Benign neoplasm of left breast: Secondary | ICD-10-CM

## 2023-02-19 NOTE — Progress Notes (Signed)
[facility] History and Physical  Reason for Referral: Intraductal papilloma of the left breast Referring Physician: Cyril Mourning, NP  Chief Complaint   New Patient (Initial Visit)     Julia [name] is a 37 y.o. female.  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  She went to her gynecologist recently with a complaint of left nipple discharge that was white in color.  She equated to being similar to when she was breast-feeding.  She was also complaining of some nipple soreness.  The patient has no history of any masses, lumps, or bumps.  She had menarche at age 57, and her first pregnancy at age 89. She is G2P3. She did breastfeed her children.  She has a history of breast cancer in her maternal grandmother who was diagnosed later in life.  She denies any other family history of any kind of cancer.  She has never had any previous biopsies or concerning areas on mammogram.  She has not had any chest radiation.  She does have a history of cysts excised from her breast when she was in junior high, and she believes they were on the left breast.  This was her first mammogram.  Her past medical history significant for hypertension.  She denies use of blood thinning medications.  She denies any other surgeries besides her left breast cyst excision.  She vapes a cartridge per week and drinks alcohol 3-4 times per week.  She denies use of illicit drugs.   Past Medical History:  Diagnosis Date   ASCUS of cervix with negative high risk HPV 06/10/2018   Repeat in 1 year   Gestational diabetes    diet controlled with G1   HSV-2 seropositive    Hx of trichomoniasis    Hypertension    IUD (intrauterine device) in place 04/18/2017   Inserted 03/30/13   Preeclampsia    Pregnancy induced hypertension     Past Surgical History:  Procedure Laterality Date   BREAST BIOPSY Left 01/24/2023   [place] LT BREAST BX W LOC DEV 1ST LESION IMG BX SPEC US  GUIDE 01/24/2023 [facility]-UL[facility]ASOUND   BREAST BIOPSY Left 01/24/2023   [place] LT BREAST BX W LOC DEV EA ADD LESION IMG BX SPEC US GUIDE 01/24/2023 [facility]-ULTRASOUND   BREAST CYST EXCISION Left    in 8th and 10th grade   BREAST SURGERY      Family History  Problem Relation Age of Onset   Hypertension Mother    Breast cancer Maternal Grandmother    Cancer Maternal Grandmother    Heart attack Maternal Grandfather    Asthma Son    Arthritis Neg Hx    Alcohol abuse Neg Hx    Birth defects Neg Hx    COPD Neg Hx    Depression Neg Hx    Diabetes Neg Hx    Drug abuse Neg Hx    Early death Neg Hx    Hearing loss Neg Hx    Heart disease Neg Hx    Hyperlipidemia Neg Hx    Kidney disease Neg Hx    Learning disabilities Neg Hx    Mental illness Neg Hx    Mental retardation Neg Hx    Miscarriages / Stillbirths Neg Hx    Stroke Neg Hx    Vision loss Neg Hx    Varicose Veins Neg Hx     Social History   Tobacco Use   Smoking status: Former  Types: Cigarettes   Smokeless tobacco: Never  Vaping Use   Vaping status: Every Day  Substance Use Topics   Alcohol use: Yes    Comment: occ   Drug use: No    Medications: I have reviewed the patient's current medications. Allergies as of 02/19/2023   No Known Allergies      Medication List        Accurate as of February 19, 2023 11:59 PM. If you have any questions, ask your nurse or doctor.          Liletta (52 MG) 20.1 MCG/DAY Iud IUD Generic drug: levonorgestrel 1 each by Intrauterine route once.   lisinopril 10 MG tablet Commonly known as: ZESTRIL Take 1 tablet (10 mg total) by mouth daily.   loratadine 10 MG tablet Commonly known as: CLARITIN Take 10 mg by mouth daily.   multivitamin tablet Take 1 tablet by mouth daily.   TYLENOL PO Take by mouth.         ROS:  Constitutional: negative for chills, fatigue, and fevers Eyes: negative for visual disturbance and pain Ears, nose, mouth, throat, and face: positive for sinus  problems, negative for ear drainage and sore throat Respiratory: positive for cough, negative for wheezing and shortness of breath Cardiovascular: negative for chest pain and palpitations Gastrointestinal: positive for reflux symptoms, negative for abdominal pain, nausea, and vomiting Genitourinary:negative for dysuria and frequency Integument/breast: negative for dryness and rash Hematologic/lymphatic: negative for bleeding and lymphadenopathy Musculoskeletal:negative for back pain and neck pain Neurological: negative for dizziness and tremors Endocrine: negative for temperature intolerance  Blood pressure 118/79, pulse 71, temperature 98.3 F (36.8 C), temperature source Oral, resp. rate 12, height 5\' 6"  (1.676 m), weight 227 lb (103 kg), SpO2 96%. Physical Exam Vitals reviewed.  Constitutional:      Appearance: Normal appearance.  HENT:     Head: Normocephalic and atraumatic.  Eyes:     Extraocular Movements: Extraocular movements intact.     Pupils: Pupils are equal, round, and reactive to light.  Cardiovascular:     Rate and Rhythm: Normal rate and regular rhythm.  Pulmonary:     Effort: Pulmonary effort is normal.     Breath sounds: Normal breath sounds.  Chest:  Breasts:    Right: Normal. No swelling, inverted nipple, mass, nipple discharge, skin change or tenderness.     Left: Normal. No swelling, inverted nipple, mass, nipple discharge, skin change or tenderness.  Abdominal:     General: There is no distension.     Palpations: Abdomen is soft.     Tenderness: There is no abdominal tenderness.  Musculoskeletal:        General: Normal range of motion.     Cervical back: Normal range of motion.  Lymphadenopathy:     Upper Body:     Right upper body: No supraclavicular or axillary adenopathy.     Left upper body: No supraclavicular or axillary adenopathy.  Skin:    General: Skin is warm and dry.  Neurological:     General: No focal deficit present.     Mental  Status: She is alert and oriented to person, place, and time.  Psychiatric:        Mood and Affect: Mood normal.        Behavior: Behavior normal.     Results: Bilateral breast mammogram (01/15/23): FINDINGS: No suspicious masses or calcifications are seen in the right breast. There are tubular masses in the slightly upper outer left breast suggestive  of dilated ducts.   Targeted ultrasound of the left breast was performed. There is an intraductal mass in the left breast at 4 o'clock 2 cm from the nipple measuring 1 x 0.5 x 0.6 cm. There is an intraductal mass in the left breast at the 4:30 position 2 cm from nipple measuring 1.2 x 0.6 x 0.6 cm. These masses correspond with the masses/dilated ducts seen in the upper-outer left breast at mammography. No lymphadenopathy seen in the left axilla. The left axillary lymph nodes are similar in appearance when compared to the right.   IMPRESSION: 1 cm intraductal mass in the left breast at the 4 o'clock position and 1.2 cm intraductal mass in the left breast at the 4:30 position in this patient with unilateral clear and bloody left nipple discharge.   RECOMMENDATION: Recommend ultrasound-guided core biopsy of the 2 intraductal masses in the left breast at the 4 and 430 positions.   I have discussed the findings and recommendations with the patient. If applicable, a reminder letter will be sent to the patient regarding the next appointment.   BI-RADS CATEGORY  4: Suspicious.  Left breast ultrasound (01/15/2023): FINDINGS: No suspicious masses or calcifications are seen in the right breast. There are tubular masses in the slightly upper outer left breast suggestive of dilated ducts.   Targeted ultrasound of the left breast was performed. There is an intraductal mass in the left breast at 4 o'clock 2 cm from the nipple measuring 1 x 0.5 x 0.6 cm. There is an intraductal mass in the left breast at the 4:30 position 2 cm from  nipple measuring 1.2 x 0.6 x 0.6 cm. These masses correspond with the masses/dilated ducts seen in the upper-outer left breast at mammography. No lymphadenopathy seen in the left axilla. The left axillary lymph nodes are similar in appearance when compared to the right.   IMPRESSION: 1 cm intraductal mass in the left breast at the 4 o'clock position and 1.2 cm intraductal mass in the left breast at the 4:30 position in this patient with unilateral clear and bloody left nipple discharge.   RECOMMENDATION: Recommend ultrasound-guided core biopsy of the 2 intraductal masses in the left breast at the 4 and 430 positions.   I have discussed the findings and recommendations with the patient. If applicable, a reminder letter will be sent to the patient regarding the next appointment.   BI-RADS CATEGORY  4: Suspicious.  Ultrasound-guided breast biopsy (01/24/2023): IMPRESSION: 1. Ultrasound-guided core biopsy of the mass in the left breast at the 4 o'clock position at site of ribbon shaped biopsy marking clip.   2. Ultrasound-guided core biopsy of the mass in the left breast at the 4:30 position, at site of wing shaped biopsy marking clip.  ADDENDUM: PATHOLOGY revealed: Site A. LEFT BREAST, MASS @ 4:00, NEEDLE CORE BIOPSY: Fragments of a sclerosed intraductal papilloma and fibrocystic changes. Fibrocystic changes including stromal fibrosis, cystic dilatation of ducts, adenosis, apocrine metaplasia and usual duct hyperplasia. Negative for microcalcifications. Negative for atypia and carcinoma.   Pathology results are CONCORDANT with imaging findings, per Dr. Edwin Cap with possible excision recommended.   PATHOLOGY revealed: Site   B. LEFT BREAST, MASS @ 4:30, NEEDLE CORE BIOPSY: Fragments of an intraductal, focally sclerosed, papilloma with fibrocystic changes including usual duct hyperplasia and extensive apocrine metaplasia. Microcalcification present. Negative for atypia  and carcinoma.   Pathology results are CONCORDANT with imaging findings, per Dr. Edwin Cap with possible excision recommended.   Pathology results and recommendations  below were discussed with patient by telephone on 01/25/2023. Patient reported biopsy site within normal limits with slight tenderness at the site. Post biopsy care instructions were reviewed, questions were answered and my direct phone number was provided to patient. Patient was instructed to call [facility] Mammography Department if any concerns or questions arise related to the biopsy.   RECOMMENDATION: Surgical consultation for consideration of excision given persistent LEFT nipple discharge. Request for surgical and oncological consultation was relayed to Ellin Mayhew RT at [facility] Mammography Department by Randa Lynn RN on 01/25/2023.   Pathology results reported by Randa Lynn RN on 01/25/2023.   Assessment & Plan:  Julia [name] is a 37 y.o. female who presents for evaluation of a left breast intraductal papilloma.  -We discussed the pathophysiology of intraductal papillomas, that this is a benign breast finding.  Further discussed that there can sometimes be DCIS or breast cancer that is unmasked by an intraductal papilloma, which is why we recommend surgical excision -We further discussed that pending her final pathology, she could potentially need further interventions if margins are noted to be positive or if she is noted to have a breast cancer -The risk and benefits of left breast excisional biopsy were discussed including but not limited to bleeding, infection, injury to surrounding structures, and need for additional procedures.  After careful consideration, Julia [name] has decided to proceed with surgery.  -She will need to undergo radiology placed tag prior to surgery -Patient tentatively scheduled for surgery on 12/19 -Information provided to the patient  regarding intraductal papillomas -According to the [facility], patient has a 0.4% 5 year risk of breast cancer and a 9.9% lifetime risk of breast cancer  All questions were answered to the satisfaction of the patient.  Theophilus Kinds, DO [facility] [address] [place], [place] [address] [phone] (office)

## 2023-02-22 NOTE — H&P (Signed)
[facility] History and Physical  Reason for Referral: Intraductal papilloma of the left breast Referring Physician: Cyril Mourning, NP  Chief Complaint   New Patient (Initial Visit)     Julia [name] is a 37 y.o. female.  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  She went to her gynecologist recently with a complaint of left nipple discharge that was white in color.  She equated to being similar to when she was breast-feeding.  She was also complaining of some nipple soreness.  The patient has no history of any masses, lumps, or bumps.  She had menarche at age 57, and her first pregnancy at age 89. She is G2P3. She did breastfeed her children.  She has a history of breast cancer in her maternal grandmother who was diagnosed later in life.  She denies any other family history of any kind of cancer.  She has never had any previous biopsies or concerning areas on mammogram.  She has not had any chest radiation.  She does have a history of cysts excised from her breast when she was in junior high, and she believes they were on the left breast.  This was her first mammogram.  Her past medical history significant for hypertension.  She denies use of blood thinning medications.  She denies any other surgeries besides her left breast cyst excision.  She vapes a cartridge per week and drinks alcohol 3-4 times per week.  She denies use of illicit drugs.   Past Medical History:  Diagnosis Date   ASCUS of cervix with negative high risk HPV 06/10/2018   Repeat in 1 year   Gestational diabetes    diet controlled with G1   HSV-2 seropositive    Hx of trichomoniasis    Hypertension    IUD (intrauterine device) in place 04/18/2017   Inserted 03/30/13   Preeclampsia    Pregnancy induced hypertension     Past Surgical History:  Procedure Laterality Date   BREAST BIOPSY Left 01/24/2023   [place] LT BREAST BX W LOC DEV 1ST LESION IMG BX SPEC US  GUIDE 01/24/2023 [facility]-ULTRASOUND   BREAST BIOPSY Left 01/24/2023   [place] LT BREAST BX W LOC DEV EA ADD LESION IMG BX SPEC US GUIDE 01/24/2023 [facility]-ULTRASOUND   BREAST CYST EXCISION Left    in 8th and 10th grade   BREAST SURGERY      Family History  Problem Relation Age of Onset   Hypertension Mother    Breast cancer Maternal Grandmother    Cancer Maternal Grandmother    Heart attack Maternal Grandfather    Asthma Son    Arthritis Neg Hx    Alcohol abuse Neg Hx    Birth defects Neg Hx    COPD Neg Hx    Depression Neg Hx    Diabetes Neg Hx    Drug abuse Neg Hx    Early death Neg Hx    Hearing loss Neg Hx    Heart disease Neg Hx    Hyperlipidemia Neg Hx    Kidney disease Neg Hx    Learning disabilities Neg Hx    Mental illness Neg Hx    Mental retardation Neg Hx    Miscarriages / Stillbirths Neg Hx    Stroke Neg Hx    Vision loss Neg Hx    Varicose Veins Neg Hx     Social History   Tobacco Use   Smoking status: Former  Types: Cigarettes   Smokeless tobacco: Never  Vaping Use   Vaping status: Every Day  Substance Use Topics   Alcohol use: Yes    Comment: occ   Drug use: No    Medications: I have reviewed the patient's current medications. Allergies as of 02/19/2023   No Known Allergies      Medication List        Accurate as of February 19, 2023 11:59 PM. If you have any questions, ask your nurse or doctor.          Liletta (52 MG) 20.1 MCG/DAY Iud IUD Generic drug: levonorgestrel 1 each by Intrauterine route once.   lisinopril 10 MG tablet Commonly known as: ZESTRIL Take 1 tablet (10 mg total) by mouth daily.   loratadine 10 MG tablet Commonly known as: CLARITIN Take 10 mg by mouth daily.   multivitamin tablet Take 1 tablet by mouth daily.   TYLENOL PO Take by mouth.         ROS:  Constitutional: negative for chills, fatigue, and fevers Eyes: negative for visual disturbance and pain Ears, nose, mouth, throat, and face: positive for sinus  problems, negative for ear drainage and sore throat Respiratory: positive for cough, negative for wheezing and shortness of breath Cardiovascular: negative for chest pain and palpitations Gastrointestinal: positive for reflux symptoms, negative for abdominal pain, nausea, and vomiting Genitourinary:negative for dysuria and frequency Integument/breast: negative for dryness and rash Hematologic/lymphatic: negative for bleeding and lymphadenopathy Musculoskeletal:negative for back pain and neck pain Neurological: negative for dizziness and tremors Endocrine: negative for temperature intolerance  Blood pressure 118/79, pulse 71, temperature 98.3 F (36.8 C), temperature source Oral, resp. rate 12, height 5\' 6"  (1.676 m), weight 227 lb (103 kg), SpO2 96%. Physical Exam Vitals reviewed.  Constitutional:      Appearance: Normal appearance.  HENT:     Head: Normocephalic and atraumatic.  Eyes:     Extraocular Movements: Extraocular movements intact.     Pupils: Pupils are equal, round, and reactive to light.  Cardiovascular:     Rate and Rhythm: Normal rate and regular rhythm.  Pulmonary:     Effort: Pulmonary effort is normal.     Breath sounds: Normal breath sounds.  Chest:  Breasts:    Right: Normal. No swelling, inverted nipple, mass, nipple discharge, skin change or tenderness.     Left: Normal. No swelling, inverted nipple, mass, nipple discharge, skin change or tenderness.  Abdominal:     General: There is no distension.     Palpations: Abdomen is soft.     Tenderness: There is no abdominal tenderness.  Musculoskeletal:        General: Normal range of motion.     Cervical back: Normal range of motion.  Lymphadenopathy:     Upper Body:     Right upper body: No supraclavicular or axillary adenopathy.     Left upper body: No supraclavicular or axillary adenopathy.  Skin:    General: Skin is warm and dry.  Neurological:     General: No focal deficit present.     Mental  Status: She is alert and oriented to person, place, and time.  Psychiatric:        Mood and Affect: Mood normal.        Behavior: Behavior normal.     Results: Bilateral breast mammogram (01/15/23): FINDINGS: No suspicious masses or calcifications are seen in the right breast. There are tubular masses in the slightly upper outer left breast suggestive  of dilated ducts.   Targeted ultrasound of the left breast was performed. There is an intraductal mass in the left breast at 4 o'clock 2 cm from the nipple measuring 1 x 0.5 x 0.6 cm. There is an intraductal mass in the left breast at the 4:30 position 2 cm from nipple measuring 1.2 x 0.6 x 0.6 cm. These masses correspond with the masses/dilated ducts seen in the upper-outer left breast at mammography. No lymphadenopathy seen in the left axilla. The left axillary lymph nodes are similar in appearance when compared to the right.   IMPRESSION: 1 cm intraductal mass in the left breast at the 4 o'clock position and 1.2 cm intraductal mass in the left breast at the 4:30 position in this patient with unilateral clear and bloody left nipple discharge.   RECOMMENDATION: Recommend ultrasound-guided core biopsy of the 2 intraductal masses in the left breast at the 4 and 430 positions.   I have discussed the findings and recommendations with the patient. If applicable, a reminder letter will be sent to the patient regarding the next appointment.   BI-RADS CATEGORY  4: Suspicious.  Left breast ultrasound (01/15/2023): FINDINGS: No suspicious masses or calcifications are seen in the right breast. There are tubular masses in the slightly upper outer left breast suggestive of dilated ducts.   Targeted ultrasound of the left breast was performed. There is an intraductal mass in the left breast at 4 o'clock 2 cm from the nipple measuring 1 x 0.5 x 0.6 cm. There is an intraductal mass in the left breast at the 4:30 position 2 cm from  nipple measuring 1.2 x 0.6 x 0.6 cm. These masses correspond with the masses/dilated ducts seen in the upper-outer left breast at mammography. No lymphadenopathy seen in the left axilla. The left axillary lymph nodes are similar in appearance when compared to the right.   IMPRESSION: 1 cm intraductal mass in the left breast at the 4 o'clock position and 1.2 cm intraductal mass in the left breast at the 4:30 position in this patient with unilateral clear and bloody left nipple discharge.   RECOMMENDATION: Recommend ultrasound-guided core biopsy of the 2 intraductal masses in the left breast at the 4 and 430 positions.   I have discussed the findings and recommendations with the patient. If applicable, a reminder letter will be sent to the patient regarding the next appointment.   BI-RADS CATEGORY  4: Suspicious.  Ultrasound-guided breast biopsy (01/24/2023): IMPRESSION: 1. Ultrasound-guided core biopsy of the mass in the left breast at the 4 o'clock position at site of ribbon shaped biopsy marking clip.   2. Ultrasound-guided core biopsy of the mass in the left breast at the 4:30 position, at site of wing shaped biopsy marking clip.  ADDENDUM: PATHOLOGY revealed: Site A. LEFT BREAST, MASS @ 4:00, NEEDLE CORE BIOPSY: Fragments of a sclerosed intraductal papilloma and fibrocystic changes. Fibrocystic changes including stromal fibrosis, cystic dilatation of ducts, adenosis, apocrine metaplasia and usual duct hyperplasia. Negative for microcalcifications. Negative for atypia and carcinoma.   Pathology results are CONCORDANT with imaging findings, per Dr. Edwin Cap with possible excision recommended.   PATHOLOGY revealed: Site   B. LEFT BREAST, MASS @ 4:30, NEEDLE CORE BIOPSY: Fragments of an intraductal, focally sclerosed, papilloma with fibrocystic changes including usual duct hyperplasia and extensive apocrine metaplasia. Microcalcification present. Negative for atypia  and carcinoma.   Pathology results are CONCORDANT with imaging findings, per Dr. Edwin Cap with possible excision recommended.   Pathology results and recommendations  below were discussed with patient by telephone on 01/25/2023. Patient reported biopsy site within normal limits with slight tenderness at the site. Post biopsy care instructions were reviewed, questions were answered and my direct phone number was provided to patient. Patient was instructed to call [facility] Mammography Department if any concerns or questions arise related to the biopsy.   RECOMMENDATION: Surgical consultation for consideration of excision given persistent LEFT nipple discharge. Request for surgical and oncological consultation was relayed to Ellin Mayhew RT at [facility] Mammography Department by Randa Lynn RN on 01/25/2023.   Pathology results reported by Randa Lynn RN on 01/25/2023.   Assessment & Plan:  Julia [name] is a 37 y.o. female who presents for evaluation of a left breast intraductal papilloma.  -We discussed the pathophysiology of intraductal papillomas, that this is a benign breast finding.  Further discussed that there can sometimes be DCIS or breast cancer that is unmasked by an intraductal papilloma, which is why we recommend surgical excision -We further discussed that pending her final pathology, she could potentially need further interventions if margins are noted to be positive or if she is noted to have a breast cancer -The risk and benefits of left breast excisional biopsy were discussed including but not limited to bleeding, infection, injury to surrounding structures, and need for additional procedures.  After careful consideration, TIEARA AREVALOS has decided to proceed with surgery.  -She will need to undergo radiology placed tag prior to surgery -Patient tentatively scheduled for surgery on 12/19 -Information provided to the patient  regarding intraductal papillomas -According to the [facility], patient has a 0.4% 5 year risk of breast cancer and a 9.9% lifetime risk of breast cancer  All questions were answered to the satisfaction of the patient.  Theophilus Kinds, DO [facility] [address] [place], [place] [address] [phone] (office)

## 2023-03-01 NOTE — Patient Instructions (Signed)
Julia [name]  03/01/2023     @PREFPERIOPPHARMACY @   Your procedure is scheduled on  03/07/2023.   Report to [facility] at  0600 A.M.   Call this number if you have problems the morning of surgery:  [phone]  If you experience any cold or flu symptoms such as cough, fever, chills, shortness of breath, etc. between now and your scheduled surgery, please notify us at the above number.   Remember:  Do not eat after midnight.    You may drink clear liquids until 0330 am on 03/07/2023.    Clear liquids allowed are:                    Water, Juice (No red color; non-citric and without pulp; diabetics please choose diet or no sugar options), Carbonated beverages (diabetics please choose diet or no sugar options), Clear Tea (No creamer, milk, or cream, including half & half and powdered creamer), Black Coffee Only (No creamer, milk or cream, including half & half and powdered creamer), and Clear Sports drink (No red color; diabetics please choose diet or no sugar options)    Take these medicines the morning of surgery with A SIP OF WATER                                                  None.    Do not wear jewelry, make-up or nail polish, including gel polish,  artificial nails, or any other type of covering on natural nails (fingers and  toes).  Do not wear lotions, powders, or perfumes, or deodorant.  Do not shave 48 hours prior to surgery.  Men may shave face and neck.  Do not bring valuables to the hospital.  [facility] is not responsible for any belongings or valuables.  Contacts, dentures or bridgework may not be worn into surgery.  Leave your suitcase in the car.  After surgery it may be brought to your room.  For patients admitted to the hospital, discharge time will be determined by your treatment team.  Patients discharged the day of surgery will not be allowed to drive home and must have someone with them for 24 hours.    Special instructions:   DO  NOT smoke tobacco or vape for 24 hours before your procedure.  Please read over the following fact sheets that you were given. Coughing and Deep Breathing, Surgical Site Infection Prevention, Anesthesia Post-op Instructions, and Care and Recovery After Surgery        Breast Biopsy, Care After The following information offers guidance on how to care for yourself after your breast biopsy. Your doctor may also give you more specific instructions. If you have problems or questions, contact your doctor. What can I expect after the procedure? After a breast biopsy, it is common to have: Bruising on your breast. Breast swelling. Numbness, tingling, or pain near your biopsy site. This site is where tissue was taken out for study. Follow these instructions at home: Medicines Take over-the-counter and prescription medicines only as told by your doctor. If you were given a sedative during your procedure, do not drive or use machines until your doctor says that it is safe. A sedative is a medicine that helps you relax. Do not drink alcohol while taking pain medicine. Ask your  doctor if you should avoid driving or using machines while you are taking your medicine. Biopsy site care     Follow instructions from your doctor about how to take care of your cut from surgery (incision) or your puncture site. Make sure you: Wash your hands with soap and water for at least 20 seconds before and after you change your bandage. If you cannot use soap and water, use hand sanitizer. Change your bandage. Leave stitches or skin glue in place for at least 2 weeks. Leave tape strips alone unless you are told to take them off. You may trim the edges of the tape strips if they curl up. If you have stitches, keep them dry when you take a bath or a shower. Check your cut or puncture site every day for signs of infection. Look for: More redness, swelling, or pain. More fluid or blood. Warmth. Pus or a bad  smell. Protect the biopsy site. Do not let the site get bumped. Managing pain If told, put ice on the biopsy site. To do this: Put ice in a plastic bag. Place a towel between your skin and the bag. Leave the ice on for 20 minutes, 2-3 times a day. Take off the ice if your skin turns bright red. This is very important. If you cannot feel pain, heat, or cold, you have a greater risk of damage to the area. Activity If a cut was made in your skin to do the biopsy, avoid activities that could pull your cut open. These include: Stretching. Reaching over your head. Exercise. Sports. Lifting anything that weighs more than 3 lb (1.4 kg). Return to your normal activities when your doctor says that it is safe. General instructions Follow your normal diet. Wear a good support bra for as long as told by your doctor. Get checked for extra fluid around your lymph nodes (lymphedema) as often as told. Do not smoke or use any products that contain nicotine or tobacco. If you need help quitting, ask your doctor. Keep all follow-up visits. Contact a doctor if: You notice any of these at or near the biopsy site: More redness, swelling, or pain. More fluid or blood. Warmth. Pus or a bad smell. The site breaking open after the stitches or skin tape strips have been removed. You have a rash or a fever. Get help right away if: You have trouble breathing. You have red streaks around the biopsy site. Summary After a breast biopsy, it is common to have bruising, numbness, tingling, or pain near your biopsy site. Ask your doctor if you should avoid driving or using machines while you are taking your medicine. If you had a cut made in your skin to do the biopsy, avoid activities that may pull the cut open. Return to your normal activities when your doctor says that it is safe. Wear a good support bra for as long as told by your doctor. This information is not intended to replace advice given to you by your  health care provider. Make sure you discuss any questions you have with your health care provider. Document Revised: 12/28/2020 Document Reviewed: 12/28/2020 Elsevier Patient Education  2024 [employer] General Anesthesia, Adult, Care After The following information offers guidance on how to care for yourself after your procedure. Your health care provider may also give you more specific instructions. If you have problems or questions, contact your health care provider. What can I expect after the procedure? After the procedure, it is common for  people to: Have pain or discomfort at the IV site. Have nausea or vomiting. Have a sore throat or hoarseness. Have trouble concentrating. Feel cold or chills. Feel weak, sleepy, or tired (fatigue). Have soreness and body aches. These can affect parts of the body that were not involved in surgery. Follow these instructions at home: For the time period you were told by your health care provider:  Rest. Do not participate in activities where you could fall or become injured. Do not drive or use machinery. Do not drink alcohol. Do not take sleeping pills or medicines that cause drowsiness. Do not make important decisions or sign legal documents. Do not take care of children on your own. General instructions Drink enough fluid to keep your urine pale yellow. If you have sleep apnea, surgery and certain medicines can increase your risk for breathing problems. Follow instructions from your health care provider about wearing your sleep device: Anytime you are sleeping, including during daytime naps. While taking prescription pain medicines, sleeping medicines, or medicines that make you drowsy. Return to your normal activities as told by your health care provider. Ask your health care provider what activities are safe for you. Take over-the-counter and prescription medicines only as told by your health care provider. Do not use any products that  contain nicotine or tobacco. These products include cigarettes, chewing tobacco, and vaping devices, such as e-cigarettes. These can delay incision healing after surgery. If you need help quitting, ask your health care provider. Contact a health care provider if: You have nausea or vomiting that does not get better with medicine. You vomit every time you eat or drink. You have pain that does not get better with medicine. You cannot urinate or have bloody urine. You develop a skin rash. You have a fever. Get help right away if: You have trouble breathing. You have chest pain. You vomit blood. These symptoms may be an emergency. Get help right away. Call 911. Do not wait to see if the symptoms will go away. Do not drive yourself to the hospital. Summary After the procedure, it is common to have a sore throat, hoarseness, nausea, vomiting, or to feel weak, sleepy, or fatigue. For the time period you were told by your health care provider, do not drive or use machinery. Get help right away if you have difficulty breathing, have chest pain, or vomit blood. These symptoms may be an emergency. This information is not intended to replace advice given to you by your health care provider. Make sure you discuss any questions you have with your health care provider. Document Revised: 06/02/2021 Document Reviewed: 06/02/2021 Elsevier Patient Education  2024 [employer] Inc. How to Use Chlorhexidine Before Surgery Chlorhexidine gluconate (CHG) is a germ-killing (antiseptic) solution that is used to clean the skin. It can get rid of the bacteria that normally live on the skin and can keep them away for about 24 hours. To clean your skin with CHG, you may be given: A CHG solution to use in the shower or as part of a sponge bath. A prepackaged cloth that contains CHG. Cleaning your skin with CHG may help lower the risk for infection: While you are staying in the intensive care unit of the hospital. If you  have a vascular access, such as a central line, to provide short-term or long-term access to your veins. If you have a catheter to drain urine from your bladder. If you are on a ventilator. A ventilator is a machine that helps  you breathe by moving air in and out of your lungs. After surgery. What are the risks? Risks of using CHG include: A skin reaction. Hearing loss, if CHG gets in your ears and you have a perforated eardrum. Eye injury, if CHG gets in your eyes and is not rinsed out. The CHG product catching fire. Make sure that you avoid smoking and flames after applying CHG to your skin. Do not use CHG: If you have a chlorhexidine allergy or have previously reacted to chlorhexidine. On babies younger than 12 months of age. How to use CHG solution Use CHG only as told by your health care provider, and follow the instructions on the label. Use the full amount of CHG as directed. Usually, this is one bottle. During a shower Follow these steps when using CHG solution during a shower (unless your health care provider gives you different instructions): Start the shower. Use your normal soap and shampoo to wash your face and hair. Turn off the shower or move out of the shower stream. Pour the CHG onto a clean washcloth. Do not use any type of brush or rough-edged sponge. Starting at your neck, lather your body down to your toes. Make sure you follow these instructions: If you will be having surgery, pay special attention to the part of your body where you will be having surgery. Scrub this area for at least 1 minute. Do not use CHG on your head or face. If the solution gets into your ears or eyes, rinse them well with water. Avoid your genital area. Avoid any areas of skin that have broken skin, cuts, or scrapes. Scrub your back and under your arms. Make sure to wash skin folds. Let the lather sit on your skin for 1-2 minutes or as long as told by your health care provider. Thoroughly  rinse your entire body in the shower. Make sure that all body creases and crevices are rinsed well. Dry off with a clean towel. Do not put any substances on your body afterward--such as powder, lotion, or perfume--unless you are told to do so by your health care provider. Only use lotions that are recommended by the manufacturer. Put on clean clothes or pajamas. If it is the night before your surgery, sleep in clean sheets.  During a sponge bath Follow these steps when using CHG solution during a sponge bath (unless your health care provider gives you different instructions): Use your normal soap and shampoo to wash your face and hair. Pour the CHG onto a clean washcloth. Starting at your neck, lather your body down to your toes. Make sure you follow these instructions: If you will be having surgery, pay special attention to the part of your body where you will be having surgery. Scrub this area for at least 1 minute. Do not use CHG on your head or face. If the solution gets into your ears or eyes, rinse them well with water. Avoid your genital area. Avoid any areas of skin that have broken skin, cuts, or scrapes. Scrub your back and under your arms. Make sure to wash skin folds. Let the lather sit on your skin for 1-2 minutes or as long as told by your health care provider. Using a different clean, wet washcloth, thoroughly rinse your entire body. Make sure that all body creases and crevices are rinsed well. Dry off with a clean towel. Do not put any substances on your body afterward--such as powder, lotion, or perfume--unless you are told to do  so by your health care provider. Only use lotions that are recommended by the manufacturer. Put on clean clothes or pajamas. If it is the night before your surgery, sleep in clean sheets. How to use CHG prepackaged cloths Only use CHG cloths as told by your health care provider, and follow the instructions on the label. Use the CHG cloth on clean, dry  skin. Do not use the CHG cloth on your head or face unless your health care provider tells you to. When washing with the CHG cloth: Avoid your genital area. Avoid any areas of skin that have broken skin, cuts, or scrapes. Before surgery Follow these steps when using a CHG cloth to clean before surgery (unless your health care provider gives you different instructions): Using the CHG cloth, vigorously scrub the part of your body where you will be having surgery. Scrub using a back-and-forth motion for 3 minutes. The area on your body should be completely wet with CHG when you are done scrubbing. Do not rinse. Discard the cloth and let the area air-dry. Do not put any substances on the area afterward, such as powder, lotion, or perfume. Put on clean clothes or pajamas. If it is the night before your surgery, sleep in clean sheets.  For general bathing Follow these steps when using CHG cloths for general bathing (unless your health care provider gives you different instructions). Use a separate CHG cloth for each area of your body. Make sure you wash between any folds of skin and between your fingers and toes. Wash your body in the following order, switching to a new cloth after each step: The front of your neck, shoulders, and chest. Both of your arms, under your arms, and your hands. Your stomach and groin area, avoiding the genitals. Your right leg and foot. Your left leg and foot. The back of your neck, your back, and your buttocks. Do not rinse. Discard the cloth and let the area air-dry. Do not put any substances on your body afterward--such as powder, lotion, or perfume--unless you are told to do so by your health care provider. Only use lotions that are recommended by the manufacturer. Put on clean clothes or pajamas. Contact a health care provider if: Your skin gets irritated after scrubbing. You have questions about using your solution or cloth. You swallow any chlorhexidine. Call  your local poison control center ([phone] in the [place] Get help right away if: Your eyes itch badly, or they become very red or swollen. Your skin itches badly and is red or swollen. Your hearing changes. You have trouble seeing. You have swelling or tingling in your mouth or throat. You have trouble breathing. These symptoms may represent a serious problem that is an emergency. Do not wait to see if the symptoms will go away. Get medical help right away. Call your local emergency services (911 in the [place] Do not drive yourself to the hospital. Summary Chlorhexidine gluconate (CHG) is a germ-killing (antiseptic) solution that is used to clean the skin. Cleaning your skin with CHG may help to lower your risk for infection. You may be given CHG to use for bathing. It may be in a bottle or in a prepackaged cloth to use on your skin. Carefully follow your health care provider's instructions and the instructions on the product label. Do not use CHG if you have a chlorhexidine allergy. Contact your health care provider if your skin gets irritated after scrubbing. This information is not intended to replace advice given  to you by your health care provider. Make sure you discuss any questions you have with your health care provider. Document Revised: 07/03/2021 Document Reviewed: 05/16/2020 Elsevier Patient Education  2023 [employer].

## 2023-03-04 ENCOUNTER — Other Ambulatory Visit: Payer: Self-pay

## 2023-03-04 ENCOUNTER — Encounter (HOSPITAL_COMMUNITY): Payer: Self-pay

## 2023-03-04 ENCOUNTER — Encounter (HOSPITAL_COMMUNITY)
Admission: RE | Admit: 2023-03-04 | Discharge: 2023-03-04 | Disposition: A | Discharge status: Home / Self Care | Payer: 59 | Source: Ambulatory Visit | Attending: Surgery | Admitting: Surgery

## 2023-03-04 DIAGNOSIS — Z01818 Encounter for other preprocedural examination: Secondary | ICD-10-CM | POA: Diagnosis present

## 2023-03-05 ENCOUNTER — Other Ambulatory Visit (HOSPITAL_COMMUNITY): Payer: Self-pay | Admitting: Surgery

## 2023-03-05 ENCOUNTER — Ambulatory Visit (HOSPITAL_COMMUNITY)
Admission: RE | Admit: 2023-03-05 | Discharge: 2023-03-05 | Disposition: A | Discharge status: Home / Self Care | Payer: 59 | Source: Ambulatory Visit | Attending: Surgery | Admitting: Surgery

## 2023-03-05 DIAGNOSIS — D242 Benign neoplasm of left breast: Secondary | ICD-10-CM

## 2023-03-05 HISTORY — PX: BREAST BIOPSY: SHX20

## 2023-03-05 LAB — POCT PREGNANCY, URINE: Preg Test, Ur: NEGATIVE

## 2023-03-05 MED ORDER — LIDOCAINE HCL (PF) 2 % IJ SOLN
INTRAMUSCULAR | Status: AC
  Filled 2023-03-05: qty 20

## 2023-03-07 ENCOUNTER — Ambulatory Visit (HOSPITAL_COMMUNITY): Payer: Self-pay | Admitting: Anesthesiology

## 2023-03-07 ENCOUNTER — Ambulatory Visit (HOSPITAL_COMMUNITY)
Admission: RE | Admit: 2023-03-07 | Discharge: 2023-03-07 | Disposition: A | Discharge status: Home / Self Care | Payer: 59 | Source: Ambulatory Visit | Attending: Surgery | Admitting: Surgery

## 2023-03-07 ENCOUNTER — Encounter (HOSPITAL_COMMUNITY): Payer: Self-pay | Admitting: Surgery

## 2023-03-07 ENCOUNTER — Encounter (HOSPITAL_COMMUNITY)
Admission: RE | Disposition: A | Discharge status: Home / Self Care | Payer: Self-pay | Source: Home / Self Care | Attending: Surgery

## 2023-03-07 ENCOUNTER — Ambulatory Visit (HOSPITAL_COMMUNITY)
Admission: RE | Admit: 2023-03-07 | Discharge: 2023-03-07 | Disposition: A | Discharge status: Home / Self Care | Payer: 59 | Attending: Surgery | Admitting: Surgery

## 2023-03-07 ENCOUNTER — Ambulatory Visit (HOSPITAL_BASED_OUTPATIENT_CLINIC_OR_DEPARTMENT_OTHER): Payer: 59 | Admitting: Anesthesiology

## 2023-03-07 DIAGNOSIS — D242 Benign neoplasm of left breast: Secondary | ICD-10-CM

## 2023-03-07 DIAGNOSIS — N6082 Other benign mammary dysplasias of left breast: Secondary | ICD-10-CM | POA: Insufficient documentation

## 2023-03-07 DIAGNOSIS — I10 Essential (primary) hypertension: Secondary | ICD-10-CM | POA: Diagnosis not present

## 2023-03-07 DIAGNOSIS — D0512 Intraductal carcinoma in situ of left breast: Secondary | ICD-10-CM

## 2023-03-07 DIAGNOSIS — F17290 Nicotine dependence, other tobacco product, uncomplicated: Secondary | ICD-10-CM | POA: Insufficient documentation

## 2023-03-07 DIAGNOSIS — Z803 Family history of malignant neoplasm of breast: Secondary | ICD-10-CM | POA: Diagnosis not present

## 2023-03-07 DIAGNOSIS — N6032 Fibrosclerosis of left breast: Secondary | ICD-10-CM | POA: Diagnosis not present

## 2023-03-07 HISTORY — PX: BREAST BIOPSY WITH RADIO FREQUENCY LOCALIZER: SHX6895

## 2023-03-07 SURGERY — BREAST BIOPSY WITH RADIO FREQUENCY LOCALIZER
Anesthesia: General | Site: Breast | Laterality: Left

## 2023-03-07 MED ORDER — FENTANYL CITRATE PF 50 MCG/ML IJ SOSY: 25.0000 ug | PREFILLED_SYRINGE | INTRAMUSCULAR | Status: DC | PRN

## 2023-03-07 MED ORDER — CEFAZOLIN SODIUM-DEXTROSE 2-4 GM/100ML-% IV SOLN
2.0000 g | INTRAVENOUS | Status: AC
Start: 2023-03-07 — End: 2023-03-07
  Administered 2023-03-07: 2 mg via INTRAVENOUS

## 2023-03-07 MED ORDER — MIDAZOLAM HCL 2 MG/2ML IJ SOLN
INTRAMUSCULAR | Status: AC
  Filled 2023-03-07: qty 2

## 2023-03-07 MED ORDER — MIDAZOLAM HCL 2 MG/2ML IJ SOLN
INTRAMUSCULAR | Status: DC | PRN
  Administered 2023-03-07: 2 mg via INTRAVENOUS

## 2023-03-07 MED ORDER — LACTATED RINGERS IV SOLN: INTRAVENOUS | Status: DC

## 2023-03-07 MED ORDER — ONDANSETRON HCL 4 MG/2ML IJ SOLN
INTRAMUSCULAR | Status: AC
  Filled 2023-03-07: qty 2

## 2023-03-07 MED ORDER — ORAL CARE MOUTH RINSE: 15.0000 mL | Freq: Once | OROMUCOSAL | Status: AC

## 2023-03-07 MED ORDER — PROPOFOL 10 MG/ML IV BOLUS
INTRAVENOUS | Status: DC | PRN
  Administered 2023-03-07: 250 mg via INTRAVENOUS

## 2023-03-07 MED ORDER — CHLORHEXIDINE GLUCONATE CLOTH 2 % EX PADS
6.0000 | MEDICATED_PAD | Freq: Once | CUTANEOUS | Status: AC
  Administered 2023-03-07: 6 via TOPICAL

## 2023-03-07 MED ORDER — ONDANSETRON HCL 4 MG/2ML IJ SOLN
INTRAMUSCULAR | Status: DC | PRN
  Administered 2023-03-07: 4 mg via INTRAVENOUS

## 2023-03-07 MED ORDER — CHLORHEXIDINE GLUCONATE 0.12 % MT SOLN
OROMUCOSAL | Status: AC
  Administered 2023-03-07: 15 mL via OROMUCOSAL
  Filled 2023-03-07: qty 15

## 2023-03-07 MED ORDER — OXYCODONE HCL 5 MG PO TABS: 5.0000 mg | ORAL_TABLET | Freq: Four times a day (QID) | ORAL | 0 refills | Status: DC | PRN

## 2023-03-07 MED ORDER — DEXAMETHASONE SODIUM PHOSPHATE 10 MG/ML IJ SOLN
INTRAMUSCULAR | Status: DC | PRN
  Administered 2023-03-07: 6 mg via INTRAVENOUS

## 2023-03-07 MED ORDER — ACETAMINOPHEN 500 MG PO TABS: 1000.0000 mg | ORAL_TABLET | Freq: Four times a day (QID) | ORAL | 0 refills | Status: AC

## 2023-03-07 MED ORDER — FENTANYL CITRATE (PF) 100 MCG/2ML IJ SOLN
INTRAMUSCULAR | Status: DC | PRN
  Administered 2023-03-07: 25 ug via INTRAVENOUS
  Administered 2023-03-07: 50 ug via INTRAVENOUS
  Administered 2023-03-07 (×5): 25 ug via INTRAVENOUS

## 2023-03-07 MED ORDER — CHLORHEXIDINE GLUCONATE CLOTH 2 % EX PADS: 6.0000 | MEDICATED_PAD | Freq: Once | CUTANEOUS | Status: DC

## 2023-03-07 MED ORDER — FENTANYL CITRATE (PF) 100 MCG/2ML IJ SOLN
INTRAMUSCULAR | Status: AC
  Filled 2023-03-07: qty 2

## 2023-03-07 MED ORDER — ONDANSETRON HCL 4 MG/2ML IJ SOLN: 4.0000 mg | Freq: Once | INTRAMUSCULAR | Status: DC | PRN

## 2023-03-07 MED ORDER — CHLORHEXIDINE GLUCONATE 0.12 % MT SOLN
15.0000 mL | Freq: Once | OROMUCOSAL | Status: AC
Start: 2023-03-07 — End: 2023-03-07

## 2023-03-07 MED ORDER — OXYCODONE HCL 5 MG PO TABS: 5.0000 mg | ORAL_TABLET | Freq: Once | ORAL | Status: DC | PRN

## 2023-03-07 MED ORDER — CEFAZOLIN SODIUM-DEXTROSE 2-4 GM/100ML-% IV SOLN
INTRAVENOUS | Status: AC
  Filled 2023-03-07: qty 100

## 2023-03-07 MED ORDER — OXYCODONE HCL 5 MG/5ML PO SOLN: 5.0000 mg | Freq: Once | ORAL | Status: DC | PRN

## 2023-03-07 MED ORDER — LIDOCAINE HCL (PF) 2 % IJ SOLN
INTRAMUSCULAR | Status: AC
Start: 2023-03-07 — End: ?
  Filled 2023-03-07: qty 5

## 2023-03-07 MED ORDER — DEXAMETHASONE SODIUM PHOSPHATE 10 MG/ML IJ SOLN
INTRAMUSCULAR | Status: AC
  Filled 2023-03-07: qty 1

## 2023-03-07 MED ORDER — PROPOFOL 10 MG/ML IV BOLUS
INTRAVENOUS | Status: AC
Start: 2023-03-07 — End: ?
  Filled 2023-03-07: qty 20

## 2023-03-07 MED ORDER — BUPIVACAINE HCL (PF) 0.5 % IJ SOLN
INTRAMUSCULAR | Status: AC
Start: 2023-03-07 — End: ?
  Filled 2023-03-07: qty 30

## 2023-03-07 MED ORDER — DOCUSATE SODIUM 100 MG PO CAPS: 100.0000 mg | ORAL_CAPSULE | Freq: Two times a day (BID) | ORAL | 2 refills | Status: AC

## 2023-03-07 MED ORDER — BUPIVACAINE HCL (PF) 0.5 % IJ SOLN
INTRAMUSCULAR | Status: DC | PRN
  Administered 2023-03-07: 30 mL

## 2023-03-07 MED ORDER — PROPOFOL 10 MG/ML IV BOLUS
INTRAVENOUS | Status: AC
  Filled 2023-03-07: qty 20

## 2023-03-07 MED ORDER — LIDOCAINE HCL (CARDIAC) PF 100 MG/5ML IV SOSY
PREFILLED_SYRINGE | INTRAVENOUS | Status: DC | PRN
  Administered 2023-03-07: 100 mg via INTRAVENOUS

## 2023-03-07 MED ORDER — SODIUM CHLORIDE 0.9 % IR SOLN
Status: DC | PRN
  Administered 2023-03-07: 1000 mL

## 2023-03-07 MED ORDER — DEXMEDETOMIDINE HCL IN NACL 80 MCG/20ML IV SOLN
INTRAVENOUS | Status: DC | PRN
  Administered 2023-03-07 (×2): 4 ug via INTRAVENOUS
  Administered 2023-03-07: 12 ug via INTRAVENOUS

## 2023-03-07 MED ORDER — DEXMEDETOMIDINE HCL IN NACL 80 MCG/20ML IV SOLN
INTRAVENOUS | Status: AC
  Filled 2023-03-07: qty 20

## 2023-03-07 SURGICAL SUPPLY — 30 items
BINDER BREAST XLRG (GAUZE/BANDAGES/DRESSINGS) IMPLANT
BLADE SURG 15 STRL LF DISP TIS (BLADE) ×1 IMPLANT
CHLORAPREP W/TINT 26 (MISCELLANEOUS) ×1 IMPLANT
CLOTH BEACON ORANGE TIMEOUT ST (SAFETY) ×1 IMPLANT
COVER LIGHT HANDLE STERIS (MISCELLANEOUS) ×2 IMPLANT
DERMABOND ADVANCED .7 DNX12 (GAUZE/BANDAGES/DRESSINGS) IMPLANT
DEVICE DUBIN W/COMP PLATE 8390 (MISCELLANEOUS) ×1 IMPLANT
ELECT REM PT RETURN 9FT ADLT (ELECTROSURGICAL) ×1
ELECTRODE REM PT RTRN 9FT ADLT (ELECTROSURGICAL) ×1 IMPLANT
GLOVE BIO SURGEON STRL SZ7 (GLOVE) IMPLANT
GLOVE BIOGEL PI IND STRL 6.5 (GLOVE) ×1 IMPLANT
GLOVE BIOGEL PI IND STRL 7.0 (GLOVE) ×2 IMPLANT
GLOVE BIOGEL PI IND STRL 7.5 (GLOVE) IMPLANT
GLOVE SURG SS PI 6.5 STRL IVOR (GLOVE) ×1 IMPLANT
GOWN STRL REUS W/TWL LRG LVL3 (GOWN DISPOSABLE) ×3 IMPLANT
KIT MARKER MARGIN INK (KITS) ×1 IMPLANT
KIT TURNOVER KIT A (KITS) ×1 IMPLANT
MANIFOLD NEPTUNE II (INSTRUMENTS) ×1 IMPLANT
NDL HYPO 21X1.5 SAFETY (NEEDLE) IMPLANT
NEEDLE HYPO 21X1.5 SAFETY (NEEDLE) ×1 IMPLANT
NS IRRIG 1000ML POUR BTL (IV SOLUTION) ×1 IMPLANT
PACK MINOR (CUSTOM PROCEDURE TRAY) ×1 IMPLANT
PAD ARMBOARD 7.5X6 YLW CONV (MISCELLANEOUS) ×1 IMPLANT
POSITIONER HEAD 8X9X4 ADT (SOFTGOODS) ×1 IMPLANT
SET BASIN LINEN APH (SET/KITS/TRAYS/PACK) ×1 IMPLANT
SET LOCALIZER 20 PROBE US (MISCELLANEOUS) ×1 IMPLANT
SUT MNCRL AB 4-0 PS2 18 (SUTURE) ×1 IMPLANT
SUT SILK 2 0 SH (SUTURE) ×1 IMPLANT
SUT VIC AB 3-0 SH 27X BRD (SUTURE) ×1 IMPLANT
SYR CONTROL 10ML LL (SYRINGE) ×1 IMPLANT

## 2023-03-07 NOTE — Transfer of Care (Signed)
Immediate Anesthesia Transfer of Care Note  Patient: Julia [name]  Procedure(s) Performed: BREAST BIOPSY WITH RADIO FREQUENCY LOCALIZER (Left: Breast)  Patient Location: PACU  Anesthesia Type:General  Level of Consciousness: drowsy and patient cooperative  Airway & Oxygen Therapy: Patient Spontanous Breathing and Patient connected to face mask oxygen  Post-op Assessment: Report given to RN and Post -op Vital signs reviewed and stable  Post vital signs: Reviewed and stable  Last Vitals:  Vitals Value Taken Time  BP 132/69 03/07/23 0940  Temp 36.4 C 03/07/23 0940  Pulse 67 03/07/23 0941  Resp 22 03/07/23 0941  SpO2 95 % 03/07/23 0941  Vitals shown include unfiled device data.  Last Pain:  Vitals:   03/07/23 0940  TempSrc:   PainSc: Asleep         Complications: No notable events documented.

## 2023-03-07 NOTE — Discharge Instructions (Signed)
Ambulatory Surgery Discharge Instructions  General Anesthesia or Sedation Do not drive or operate heavy machinery for 24 hours.  Do not consume alcohol, tranquilizers, sleeping medications, or any non-prescribed medications for 24 hours. Do not make important decisions or sign any important papers in the next 24 hours. You should have someone with you tonight at home.  Activity  You are advised to go directly home from the hospital.  Restrict your activities and rest for a day.  Resume light activity tomorrow. No heavy lifting over 10 lbs or strenuous exercise. Wear your compression bra or a sports bra at all times except while bathing for the first week.  Would then recommend wearing some type of chest compression (sports bra or compression bra) for the next couple of weeks during the day.  Fluids and Diet Begin with clear liquids, bouillon, dry toast, soda crackers.  If not nauseated, you may go to a regular diet when you desire.  Greasy and spicy foods are not advised.  Medications  If you have not had a bowel movement in 24 hours, take 2 tablespoons over the counter Milk of mag.             You May resume your blood thinners tomorrow (Aspirin, coumadin, or other).  You are being discharged with prescriptions for Opioid/Narcotic Medications: There are some specific considerations for these medications that you should know. Opioid Meds have risks & benefits. Addiction to these meds is always a concern with prolonged use Take medication only as directed Do not drive while taking narcotic pain medication Do not crush tablets or capsules Do not use a different container than medication was dispensed in Lock the container of medication in a cool, dry place out of reach of children and pets. Opioid medication can cause addiction Do not share with anyone else (this is a felony) Do not store medications for future use. Dispose of them properly.     Disposal:  Find a [employer] household  drug take back site near you.  If you can't get to a drug take back site, use the recipe below as a last resort to dispose of expired, unused or unwanted drugs. Disposal  (Do not dispose chemotherapy drugs this way, talk to your prescribing doctor instead.) Step 1: Mix drugs (do not crush) with dirt, kitty litter, or used coffee grounds and add a small amount of water to dissolve any solid medications. Step 2: Seal drugs in plastic bag. Step 3: Place plastic bag in trash. Step 4: Take prescription container and scratch out personal information, then recycle or throw away.  Operative Site  You have a liquid bandage over your incisions, this will begin to flake off in about a week. Ok to English as a second language teacher. Keep wound clean and dry. No baths or swimming. No lifting more than 10 pounds.  Contact Information: If you have questions or concerns, please call our office, [phone], Monday- Thursday 8AM-5PM and Friday 8AM-12Noon.  If it is after hours or on the weekend, please call [facility] Main Number, [phone], and ask to speak to the surgeon on call for Dr. Robyne Peers at [facility].   SPECIFIC COMPLICATIONS TO WATCH FOR: Inability to urinate Fever over 101? F by mouth Nausea and vomiting lasting longer than 24 hours. Pain not relieved by medication ordered Swelling around the operative site Increased redness, warmth, hardness, around operative area Numbness, tingling, or cold fingers or toes Blood -soaked dressing, (small amounts of oozing may be normal) Increasing and progressive drainage  from surgical area or exam site

## 2023-03-07 NOTE — Interval H&P Note (Signed)
History and Physical Interval Note:  03/07/2023 7:22 AM  Julia [name]  has presented today for surgery, with the diagnosis of INTRADUCTAL PAPILLOMA, LEFT BREAST.  The various methods of treatment have been discussed with the patient and family. After consideration of risks, benefits and other options for treatment, the patient has consented to  Procedure(s): BREAST BIOPSY WITH RADIO FREQUENCY LOCALIZER (Left) as a surgical intervention.  The patient's history has been reviewed, patient examined, no change in status, stable for surgery.  I have reviewed the patient's chart and labs.  Questions were answered to the patient's satisfaction.     German Manke A Katsumi Wisler

## 2023-03-07 NOTE — Progress Notes (Signed)
[facility]  Spoke with the patient's friend, Francee Piccolo, in the consultation room.  I explained that she tolerated the procedure without difficulty.  She has dissolvable stitches under the skin with overlying skin glue.  This will flake off in 10 to 14 days.  I discharged her home with a prescription for narcotic pain medication that they should take as needed for pain.  I also want her taking scheduled Tylenol.  If they take the narcotic pain medication, they should take a stool softener as well.  The patient will follow-up with me in 2 weeks.  All questions were answered to his expressed satisfaction.  Theophilus Kinds, DO [facility] [address] [place], [place] [address] [phone] (office)

## 2023-03-07 NOTE — Anesthesia Preprocedure Evaluation (Signed)
 Anesthesia Evaluation  Patient identified by MRN, date of birth, ID band Patient awake    Reviewed: Allergy & Precautions, H&P , NPO status , Patient's Chart, lab work & pertinent test results, reviewed documented beta blocker date and time   Airway Mallampati: II  TM Distance: >3 FB Neck ROM: full    Dental no notable dental hx.    Pulmonary neg pulmonary ROS, former smoker   Pulmonary exam normal breath sounds clear to auscultation       Cardiovascular Exercise Tolerance: Good hypertension, negative cardio ROS  Rhythm:regular Rate:Normal     Neuro/Psych negative neurological ROS  negative psych ROS   GI/Hepatic negative GI ROS, Neg liver ROS,,,  Endo/Other  negative endocrine ROSdiabetes    Renal/GU negative Renal ROS  negative genitourinary   Musculoskeletal   Abdominal   Peds  Hematology negative hematology ROS (+)   Anesthesia Other Findings   Reproductive/Obstetrics negative OB ROS                             Anesthesia Physical Anesthesia Plan  ASA: 2  Anesthesia Plan: General and General LMA   Post-op Pain Management:    Induction:   PONV Risk Score and Plan: Ondansetron  Airway Management Planned:   Additional Equipment:   Intra-op Plan:   Post-operative Plan:   Informed Consent: I have reviewed the patients History and Physical, chart, labs and discussed the procedure including the risks, benefits and alternatives for the proposed anesthesia with the patient or authorized representative who has indicated his/her understanding and acceptance.     Dental Advisory Given  Plan Discussed with: CRNA  Anesthesia Plan Comments:        Anesthesia Quick Evaluation

## 2023-03-07 NOTE — Anesthesia Procedure Notes (Signed)
Procedure Name: LMA Insertion Date/Time: 03/07/2023 7:42 AM  Performed by: Oletha Cruel, CRNAPre-anesthesia Checklist: Patient identified, Emergency Drugs available, Suction available and Patient being monitored Patient Re-evaluated:Patient Re-evaluated prior to induction Oxygen Delivery Method: Circle system utilized Preoxygenation: Pre-oxygenation with 100% oxygen Induction Type: IV induction Ventilation: Two handed mask ventilation required LMA: LMA inserted LMA Size: 4.0 Number of attempts: 1 Placement Confirmation: positive ETCO2, CO2 detector and breath sounds checked- equal and bilateral Tube secured with: Tape Dental Injury: Teeth and Oropharynx as per pre-operative assessment  Comments: Atraumatic insertion of LMA size 4. Lips and teeth remain in preoperative condition.

## 2023-03-07 NOTE — Anesthesia Postprocedure Evaluation (Signed)
Anesthesia Post Note  Patient: Julia [name]  Procedure(s) Performed: BREAST BIOPSY WITH RADIO FREQUENCY LOCALIZER (Left: Breast)  Patient location during evaluation: Phase II Anesthesia Type: General Level of consciousness: awake Pain management: pain level controlled Vital Signs Assessment: post-procedure vital signs reviewed and stable Respiratory status: spontaneous breathing and respiratory function stable Cardiovascular status: blood pressure returned to baseline and stable Postop Assessment: no headache and no apparent nausea or vomiting Anesthetic complications: no Comments: Late entry   No notable events documented.   Last Vitals:  Vitals:   03/07/23 1030 03/07/23 1036  BP: (!) 125/92 131/89  Pulse: 62 67  Resp: 14 16  Temp: 36.4 C 36.4 C  SpO2: 97% 98%    Last Pain:  Vitals:   03/07/23 1036  TempSrc: Axillary  PainSc: 0-No pain                 Windell Norfolk

## 2023-03-07 NOTE — Op Note (Signed)
[facility] Operative Note  03/07/23  Preoperative Diagnosis: Intraductal papilloma   Postoperative Diagnosis: Same   Procedure(s) Performed: Excisional biopsy of left breast mass with radiofrequency localizer   Surgeon: Theophilus Kinds, DO    Assistants: Deatra Ina, MS3    Anesthesia: LMA   Anesthesiologist: Windell Norfolk, MD    Specimens: Left breast mass   Estimated Blood Loss: Minimal   Blood Replacement: None    Complications: None   Wound Class: Clean   Operative Indications: Patient is a 37 year old female who presents for excisional biopsy of left breast mass.  She was noted to have an abnormality on mammogram and [place] gruided biopsy demonstrated intraductal papilloma.  Patient is agreeable to excision of the area at this time.  All risks and benefits of performing this procedure were discussed with the patient including pain, infection, bleeding, damage to the surrounding structures, and need for more procedures or surgery. The patient voiced understanding of the procedure, all questions were sought and answered, and consent was obtained.  Findings: Left breast specimen containing 2 clips and 2 tags   Procedure: The patient was taken to the operating room and placed supine. LMA anesthesia was induced. Intravenous antibiotics were administered per protocol.  The breast, chest wall, axilla, and upper arm and neck were prepped and draped in the usual sterile fashion.  A time-out was completed verifying correct patient, procedure, site, positioning, and implant(s) and/or special equipment prior to beginning this procedure.   The Faxitron probe was used to help determine the best location for incision, and a curvilinear incision was made and flaps were raised.  The Faxitron probe was placed within the depths of the wounds and upon dissection, the clips were identified.  A ball of tissue was taken surrounding this area.  The probe was then used to  demonstrate that the clip was removed with the specimen.  The specimen was oriented using our painting system. This specimen was first sent to radiology, at which time imaging demonstrated 2 clips  and 2 tags within the specimen.  The specimen was then sent to pathology for evaluation.  Electrocautery was used to achieve hemostasis.  The cavity was localized with Marcaine.The dermis was closed 3-0 Vicryl, and 4-0 Monocryl was used to close the skin. The incision was dressed with dermabond, and compression bra was placed.  Final inspection revealed acceptable hemostasis. All counts were correct at the end of the case. The patient was awakened from anesthesia without complication.  The patient went to the PACU in stable condition.   Theophilus Kinds, DO  [facility] [address] [place], [place] [address] [phone] (office)

## 2023-03-08 ENCOUNTER — Encounter (HOSPITAL_COMMUNITY): Payer: Self-pay | Admitting: Surgery

## 2023-03-12 LAB — SURGICAL PATHOLOGY

## 2023-03-21 ENCOUNTER — Telehealth (INDEPENDENT_AMBULATORY_CARE_PROVIDER_SITE_OTHER): Payer: 59 | Admitting: Surgery

## 2023-03-21 DIAGNOSIS — Z09 Encounter for follow-up examination after completed treatment for conditions other than malignant neoplasm: Secondary | ICD-10-CM

## 2023-03-21 NOTE — Telephone Encounter (Signed)
 [facility]  Called to update the patient regarding her left breast excisional biopsy results.  I explained that the pathology demonstrated to intraductal papillomas, which is the pathology that we knew about prior to surgery.  There was no evidence of any atypia or carcinoma within her specimen.  Patient has overall been doing well since the surgery.  She has complained of some itching along her chest and arm.  She believes this could be related to the pain medication.  I advised that she can use hydrocortisone cream to the areas of itching, but to avoid putting it directly on the incision site.  She denies any issues with her incision site.  She will follow-up with me next week on January 8 for an incision check.  All questions were answered to her expressed satisfaction.  Pathology: A. 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       Completely excised.  Fibrocystic changes including stromal fibrosis, sclerosing adenosis,  apocrine metaplasia and usual duct hyperplasia and microcysts.  Biopsy site changes and biopsy clips (wing shaped clip and ribbon shaped  clip).  Negative for atypia and carcinoma.   Julia Brittle, DO [facility] [address] [place], [place] [address] [phone] (office)

## 2023-03-27 ENCOUNTER — Encounter: Payer: Self-pay | Admitting: Surgery

## 2023-03-27 ENCOUNTER — Ambulatory Visit (INDEPENDENT_AMBULATORY_CARE_PROVIDER_SITE_OTHER): Payer: 59 | Admitting: Surgery

## 2023-03-27 VITALS — BP 137/85 | HR 81 | Temp 98.4°F | Resp 16 | Ht 66.0 in | Wt 226.0 lb

## 2023-03-27 DIAGNOSIS — Z09 Encounter for follow-up examination after completed treatment for conditions other than malignant neoplasm: Secondary | ICD-10-CM

## 2023-03-27 NOTE — Progress Notes (Signed)
 [facility] Note   HPI:  38 y.o. Female presents to clinic for post-op follow-up status post excisional biopsy of left breast mass on 12/19.  Patient has overall been doing well since the surgery.  She took it very easy initially after the surgery, and has since denied any significant pain.  She did have some issues with itching, and she is not sure if this was related to the binder, her pain medication, or potentially the prep at the time of surgery.  She otherwise has no other complaints, as the itching has now resolved.  She denies fevers and chills.  Review of Systems:  All other review of systems: otherwise negative   Vital Signs:  BP 137/85   Pulse 81   Temp 98.4 F (36.9 C) (Oral)   Resp 16   Ht 5' 6 (1.676 m)   Wt 226 lb (102.5 kg)   SpO2 98%   BMI 36.48 kg/m    Physical Exam:  Physical Exam Vitals reviewed.  Constitutional:      Appearance: Normal appearance.  Chest:     Comments: Left breast surgical site healing well with minimal skin glue in place, small scab at superior aspect of incision, nontender to palpation, no surrounding erythema or induration Neurological:     Mental Status: She is alert.     Laboratory studies: None   Imaging:  None  Pathology:  A. 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       Completely excised.  Fibrocystic changes including stromal fibrosis, sclerosing adenosis,  apocrine metaplasia and usual duct hyperplasia and microcysts.  Biopsy site changes and biopsy clips (wing shaped clip and ribbon shaped  clip).  Negative for atypia and carcinoma.   Assessment:  38 y.o. yo Female who presents for follow-up status post excisional biopsy of left breast mass on 12/19  Plan:  -Patient overall doing well since the surgery.  She is tolerating a diet and pain is adequately controlled -We again discussed her pathology.  Advised that she can return to her normal breast  cancer screening -Advised that she may return to work on 1/20 without any restrictions -Follow up with me as needed  All of the above recommendations were discussed with the patient, and all of patient's questions were answered to her expressed satisfaction.  Dorothyann Brittle, DO [facility] [address] [address] [place], [place] [address] [phone] (office)

## 2024-01-20 ENCOUNTER — Other Ambulatory Visit: Payer: Self-pay | Admitting: Adult Health
